# Patient Record
Sex: Female | Born: 1945 | Race: White | Hispanic: No | Marital: Married
Health system: Southern US, Community
[De-identification: ages and names within clinical notes are randomized; demographics above are authoritative.]

## PROBLEM LIST (undated history)

## (undated) DIAGNOSIS — E119 Type 2 diabetes mellitus without complications: Secondary | ICD-10-CM

## (undated) DIAGNOSIS — I10 Essential (primary) hypertension: Secondary | ICD-10-CM

## (undated) DIAGNOSIS — G459 Transient cerebral ischemic attack, unspecified: Secondary | ICD-10-CM

## (undated) DIAGNOSIS — E079 Disorder of thyroid, unspecified: Secondary | ICD-10-CM

## (undated) DIAGNOSIS — I639 Cerebral infarction, unspecified: Secondary | ICD-10-CM

## (undated) HISTORY — DX: Disorder of thyroid, unspecified: E07.9

## (undated) HISTORY — DX: Essential (primary) hypertension: I10

## (undated) HISTORY — DX: Transient cerebral ischemic attack, unspecified: G45.9

## (undated) HISTORY — DX: Type 2 diabetes mellitus without complications: E11.9

## (undated) HISTORY — DX: Cerebral infarction, unspecified: I63.9

---

## 1986-12-01 DIAGNOSIS — Z923 Personal history of irradiation: Secondary | ICD-10-CM

## 1986-12-01 DIAGNOSIS — C50919 Malignant neoplasm of unspecified site of unspecified female breast: Secondary | ICD-10-CM

## 1986-12-01 HISTORY — DX: Personal history of irradiation: Z92.3

## 1986-12-01 HISTORY — PX: BREAST LUMPECTOMY: SHX2

## 1986-12-01 HISTORY — DX: Malignant neoplasm of unspecified site of unspecified female breast: C50.919

## 2016-09-18 DIAGNOSIS — E119 Type 2 diabetes mellitus without complications: Secondary | ICD-10-CM | POA: Insufficient documentation

## 2017-05-17 DIAGNOSIS — I63231 Cerebral infarction due to unspecified occlusion or stenosis of right carotid arteries: Secondary | ICD-10-CM | POA: Insufficient documentation

## 2019-10-06 DIAGNOSIS — M150 Primary generalized (osteo)arthritis: Secondary | ICD-10-CM | POA: Insufficient documentation

## 2019-10-06 DIAGNOSIS — M0579 Rheumatoid arthritis with rheumatoid factor of multiple sites without organ or systems involvement: Secondary | ICD-10-CM | POA: Insufficient documentation

## 2019-12-20 DIAGNOSIS — F339 Major depressive disorder, recurrent, unspecified: Secondary | ICD-10-CM | POA: Insufficient documentation

## 2019-12-20 DIAGNOSIS — N200 Calculus of kidney: Secondary | ICD-10-CM | POA: Insufficient documentation

## 2020-07-11 ENCOUNTER — Other Ambulatory Visit: Payer: Self-pay | Admitting: Family Medicine

## 2020-07-11 DIAGNOSIS — Z1231 Encounter for screening mammogram for malignant neoplasm of breast: Secondary | ICD-10-CM

## 2020-07-24 ENCOUNTER — Ambulatory Visit
Admission: RE | Admit: 2020-07-24 | Discharge: 2020-07-24 | Disposition: A | Discharge status: Home / Self Care | Payer: Medicare Other | Source: Ambulatory Visit | Attending: Family Medicine | Admitting: Family Medicine

## 2020-07-24 ENCOUNTER — Other Ambulatory Visit: Payer: Self-pay

## 2020-07-24 DIAGNOSIS — Z1231 Encounter for screening mammogram for malignant neoplasm of breast: Secondary | ICD-10-CM

## 2020-08-07 ENCOUNTER — Other Ambulatory Visit: Payer: Self-pay

## 2020-08-07 ENCOUNTER — Ambulatory Visit
Admission: RE | Admit: 2020-08-07 | Discharge: 2020-08-07 | Disposition: A | Discharge status: Home / Self Care | Payer: Medicare Other | Source: Ambulatory Visit | Attending: Family Medicine | Admitting: Family Medicine

## 2020-08-21 ENCOUNTER — Other Ambulatory Visit: Payer: Self-pay

## 2020-08-21 ENCOUNTER — Ambulatory Visit
Admission: RE | Admit: 2020-08-21 | Discharge: 2020-08-21 | Disposition: A | Discharge status: Home / Self Care | Payer: Medicare Other | Source: Ambulatory Visit | Attending: Family Medicine | Admitting: Family Medicine

## 2020-08-21 ENCOUNTER — Other Ambulatory Visit: Payer: Self-pay | Admitting: Family Medicine

## 2020-08-21 DIAGNOSIS — R0602 Shortness of breath: Secondary | ICD-10-CM

## 2020-08-21 DIAGNOSIS — R7989 Other specified abnormal findings of blood chemistry: Secondary | ICD-10-CM

## 2020-08-21 MED ORDER — IOPAMIDOL (ISOVUE-370) INJECTION 76%
75.0000 mL | Freq: Once | INTRAVENOUS | Status: AC | PRN
  Administered 2020-08-21: 75 mL via INTRAVENOUS

## 2021-01-07 DIAGNOSIS — R9439 Abnormal result of other cardiovascular function study: Secondary | ICD-10-CM | POA: Insufficient documentation

## 2021-03-12 DIAGNOSIS — R0609 Other forms of dyspnea: Secondary | ICD-10-CM | POA: Insufficient documentation

## 2021-06-27 ENCOUNTER — Other Ambulatory Visit: Payer: Self-pay | Admitting: Family Medicine

## 2021-06-27 DIAGNOSIS — Z1231 Encounter for screening mammogram for malignant neoplasm of breast: Secondary | ICD-10-CM

## 2021-08-20 ENCOUNTER — Other Ambulatory Visit: Payer: Self-pay

## 2021-08-20 ENCOUNTER — Ambulatory Visit
Admission: RE | Admit: 2021-08-20 | Discharge: 2021-08-20 | Disposition: A | Discharge status: Home / Self Care | Payer: Medicare Other | Source: Ambulatory Visit | Attending: Family Medicine | Admitting: Family Medicine

## 2021-08-20 DIAGNOSIS — Z1231 Encounter for screening mammogram for malignant neoplasm of breast: Secondary | ICD-10-CM

## 2021-12-13 DIAGNOSIS — R918 Other nonspecific abnormal finding of lung field: Secondary | ICD-10-CM | POA: Insufficient documentation

## 2022-02-07 DIAGNOSIS — B009 Herpesviral infection, unspecified: Secondary | ICD-10-CM | POA: Insufficient documentation

## 2022-02-07 DIAGNOSIS — L710 Perioral dermatitis: Secondary | ICD-10-CM | POA: Insufficient documentation

## 2022-02-07 DIAGNOSIS — L719 Rosacea, unspecified: Secondary | ICD-10-CM | POA: Insufficient documentation

## 2022-02-28 ENCOUNTER — Other Ambulatory Visit: Payer: Self-pay

## 2022-02-28 ENCOUNTER — Encounter (HOSPITAL_COMMUNITY): Payer: Self-pay

## 2022-02-28 ENCOUNTER — Observation Stay (HOSPITAL_COMMUNITY)
Admission: EM | Admit: 2022-02-28 | Discharge: 2022-03-01 | Disposition: A | Discharge status: Home / Self Care | Payer: Medicare Other | Attending: Internal Medicine | Admitting: Internal Medicine

## 2022-02-28 ENCOUNTER — Emergency Department (HOSPITAL_COMMUNITY): Payer: Medicare Other

## 2022-02-28 ENCOUNTER — Observation Stay (HOSPITAL_COMMUNITY): Payer: Medicare Other

## 2022-02-28 DIAGNOSIS — I6523 Occlusion and stenosis of bilateral carotid arteries: Secondary | ICD-10-CM | POA: Insufficient documentation

## 2022-02-28 DIAGNOSIS — I700 Atherosclerosis of aorta: Secondary | ICD-10-CM | POA: Diagnosis not present

## 2022-02-28 DIAGNOSIS — G9340 Encephalopathy, unspecified: Secondary | ICD-10-CM | POA: Diagnosis not present

## 2022-02-28 DIAGNOSIS — J9811 Atelectasis: Secondary | ICD-10-CM | POA: Insufficient documentation

## 2022-02-28 DIAGNOSIS — R531 Weakness: Secondary | ICD-10-CM | POA: Insufficient documentation

## 2022-02-28 DIAGNOSIS — Z923 Personal history of irradiation: Secondary | ICD-10-CM | POA: Insufficient documentation

## 2022-02-28 DIAGNOSIS — Z7902 Long term (current) use of antithrombotics/antiplatelets: Secondary | ICD-10-CM | POA: Diagnosis not present

## 2022-02-28 DIAGNOSIS — Z8673 Personal history of transient ischemic attack (TIA), and cerebral infarction without residual deficits: Secondary | ICD-10-CM | POA: Diagnosis not present

## 2022-02-28 DIAGNOSIS — R4701 Aphasia: Secondary | ICD-10-CM | POA: Insufficient documentation

## 2022-02-28 DIAGNOSIS — Z79899 Other long term (current) drug therapy: Secondary | ICD-10-CM | POA: Diagnosis not present

## 2022-02-28 DIAGNOSIS — Z853 Personal history of malignant neoplasm of breast: Secondary | ICD-10-CM | POA: Diagnosis not present

## 2022-02-28 DIAGNOSIS — Z9889 Other specified postprocedural states: Secondary | ICD-10-CM | POA: Diagnosis not present

## 2022-02-28 DIAGNOSIS — R29818 Other symptoms and signs involving the nervous system: Secondary | ICD-10-CM | POA: Insufficient documentation

## 2022-02-28 DIAGNOSIS — I6782 Cerebral ischemia: Secondary | ICD-10-CM | POA: Insufficient documentation

## 2022-02-28 DIAGNOSIS — I672 Cerebral atherosclerosis: Secondary | ICD-10-CM | POA: Insufficient documentation

## 2022-02-28 DIAGNOSIS — G4733 Obstructive sleep apnea (adult) (pediatric): Secondary | ICD-10-CM | POA: Insufficient documentation

## 2022-02-28 DIAGNOSIS — E119 Type 2 diabetes mellitus without complications: Secondary | ICD-10-CM | POA: Diagnosis not present

## 2022-02-28 DIAGNOSIS — Z7982 Long term (current) use of aspirin: Secondary | ICD-10-CM | POA: Diagnosis not present

## 2022-02-28 DIAGNOSIS — R2990 Unspecified symptoms and signs involving the nervous system: Secondary | ICD-10-CM

## 2022-02-28 DIAGNOSIS — R930 Abnormal findings on diagnostic imaging of skull and head, not elsewhere classified: Secondary | ICD-10-CM | POA: Insufficient documentation

## 2022-02-28 DIAGNOSIS — M069 Rheumatoid arthritis, unspecified: Secondary | ICD-10-CM | POA: Diagnosis not present

## 2022-02-28 DIAGNOSIS — I080 Rheumatic disorders of both mitral and aortic valves: Secondary | ICD-10-CM | POA: Insufficient documentation

## 2022-02-28 DIAGNOSIS — D3A090 Benign carcinoid tumor of the bronchus and lung: Secondary | ICD-10-CM | POA: Insufficient documentation

## 2022-02-28 DIAGNOSIS — Z888 Allergy status to other drugs, medicaments and biological substances status: Secondary | ICD-10-CM | POA: Insufficient documentation

## 2022-02-28 DIAGNOSIS — E7220 Disorder of urea cycle metabolism, unspecified: Secondary | ICD-10-CM | POA: Insufficient documentation

## 2022-02-28 DIAGNOSIS — Z85118 Personal history of other malignant neoplasm of bronchus and lung: Secondary | ICD-10-CM | POA: Insufficient documentation

## 2022-02-28 DIAGNOSIS — D3A8 Other benign neuroendocrine tumors: Secondary | ICD-10-CM | POA: Insufficient documentation

## 2022-02-28 DIAGNOSIS — Z794 Long term (current) use of insulin: Secondary | ICD-10-CM | POA: Insufficient documentation

## 2022-02-28 DIAGNOSIS — G4700 Insomnia, unspecified: Secondary | ICD-10-CM | POA: Insufficient documentation

## 2022-02-28 DIAGNOSIS — E039 Hypothyroidism, unspecified: Secondary | ICD-10-CM | POA: Diagnosis not present

## 2022-02-28 DIAGNOSIS — R569 Unspecified convulsions: Secondary | ICD-10-CM

## 2022-02-28 DIAGNOSIS — G4489 Other headache syndrome: Secondary | ICD-10-CM | POA: Insufficient documentation

## 2022-02-28 DIAGNOSIS — I10 Essential (primary) hypertension: Secondary | ICD-10-CM | POA: Insufficient documentation

## 2022-02-28 DIAGNOSIS — G459 Transient cerebral ischemic attack, unspecified: Secondary | ICD-10-CM | POA: Diagnosis not present

## 2022-02-28 DIAGNOSIS — Z20822 Contact with and (suspected) exposure to covid-19: Secondary | ICD-10-CM | POA: Insufficient documentation

## 2022-02-28 DIAGNOSIS — I6602 Occlusion and stenosis of left middle cerebral artery: Secondary | ICD-10-CM | POA: Insufficient documentation

## 2022-02-28 DIAGNOSIS — R414 Neurologic neglect syndrome: Secondary | ICD-10-CM | POA: Insufficient documentation

## 2022-02-28 DIAGNOSIS — Z8709 Personal history of other diseases of the respiratory system: Secondary | ICD-10-CM | POA: Diagnosis not present

## 2022-02-28 DIAGNOSIS — Z91040 Latex allergy status: Secondary | ICD-10-CM | POA: Insufficient documentation

## 2022-02-28 DIAGNOSIS — Z79890 Hormone replacement therapy (postmenopausal): Secondary | ICD-10-CM | POA: Insufficient documentation

## 2022-02-28 DIAGNOSIS — I119 Hypertensive heart disease without heart failure: Secondary | ICD-10-CM | POA: Insufficient documentation

## 2022-02-28 DIAGNOSIS — R4182 Altered mental status, unspecified: Secondary | ICD-10-CM | POA: Diagnosis present

## 2022-02-28 DIAGNOSIS — Z7969 Long term (current) use of other immunomodulators and immunosuppressants: Secondary | ICD-10-CM | POA: Insufficient documentation

## 2022-02-28 DIAGNOSIS — Z8249 Family history of ischemic heart disease and other diseases of the circulatory system: Secondary | ICD-10-CM | POA: Insufficient documentation

## 2022-02-28 DIAGNOSIS — R29702 NIHSS score 2: Secondary | ICD-10-CM | POA: Insufficient documentation

## 2022-02-28 DIAGNOSIS — R9431 Abnormal electrocardiogram [ECG] [EKG]: Secondary | ICD-10-CM | POA: Insufficient documentation

## 2022-02-28 DIAGNOSIS — Z823 Family history of stroke: Secondary | ICD-10-CM | POA: Insufficient documentation

## 2022-02-28 DIAGNOSIS — R911 Solitary pulmonary nodule: Secondary | ICD-10-CM | POA: Insufficient documentation

## 2022-02-28 LAB — RAPID URINE DRUG SCREEN, HOSP PERFORMED
Amphetamines: NOT DETECTED
Barbiturates: NOT DETECTED
Benzodiazepines: NOT DETECTED
Cocaine: NOT DETECTED
Opiates: NOT DETECTED
Tetrahydrocannabinol: NOT DETECTED

## 2022-02-28 LAB — LIPID PANEL
Cholesterol: 199 mg/dL (ref 0–200)
HDL: 52 mg/dL (ref >40)
LDL Cholesterol: 136 mg/dL — ABNORMAL HIGH (ref 0–99)
Total CHOL/HDL Ratio: 3.8 RATIO
Triglycerides: 53 mg/dL (ref <150)
VLDL: 11 mg/dL (ref 0–40)

## 2022-02-28 LAB — CBC
HCT: 41.5 % (ref 36.0–46.0)
Hemoglobin: 14.1 g/dL (ref 12.0–15.0)
MCH: 30.6 pg (ref 26.0–34.0)
MCHC: 34 g/dL (ref 30.0–36.0)
MCV: 90 fL (ref 80.0–100.0)
Platelets: 362 10*3/uL (ref 150–400)
RBC: 4.61 MIL/uL (ref 3.87–5.11)
RDW: 13.1 % (ref 11.5–15.5)
WBC: 8.8 10*3/uL (ref 4.0–10.5)
nRBC: 0 % (ref 0.0–0.2)

## 2022-02-28 LAB — DIFFERENTIAL
Abs Immature Granulocytes: 0.03 10*3/uL (ref 0.00–0.07)
Basophils Absolute: 0.1 10*3/uL (ref 0.0–0.1)
Basophils Relative: 1 %
Eosinophils Absolute: 0.1 10*3/uL (ref 0.0–0.5)
Eosinophils Relative: 2 %
Immature Granulocytes: 0 %
Lymphocytes Relative: 24 %
Lymphs Abs: 2.1 10*3/uL (ref 0.7–4.0)
Monocytes Absolute: 1 10*3/uL (ref 0.1–1.0)
Monocytes Relative: 11 %
Neutro Abs: 5.4 10*3/uL (ref 1.7–7.7)
Neutrophils Relative %: 62 %

## 2022-02-28 LAB — COMPREHENSIVE METABOLIC PANEL
ALT: 24 U/L (ref 0–44)
AST: 26 U/L (ref 15–41)
Albumin: 3.6 g/dL (ref 3.5–5.0)
Alkaline Phosphatase: 87 U/L (ref 38–126)
Anion gap: 11 (ref 5–15)
BUN: 16 mg/dL (ref 8–23)
CO2: 24 mmol/L (ref 22–32)
Calcium: 9.1 mg/dL (ref 8.9–10.3)
Chloride: 99 mmol/L (ref 98–111)
Creatinine, Ser: 0.9 mg/dL (ref 0.44–1.00)
GFR, Estimated: >60 mL/min (ref >60)
Glucose, Bld: 111 mg/dL — ABNORMAL HIGH (ref 70–99)
Potassium: 3.7 mmol/L (ref 3.5–5.1)
Sodium: 134 mmol/L — ABNORMAL LOW (ref 135–145)
Total Bilirubin: 0.8 mg/dL (ref 0.3–1.2)
Total Protein: 7.1 g/dL (ref 6.5–8.1)

## 2022-02-28 LAB — RESP PANEL BY RT-PCR (FLU A&B, COVID) ARPGX2
Influenza A by PCR: NEGATIVE
Influenza B by PCR: NEGATIVE
SARS Coronavirus 2 by RT PCR: NEGATIVE

## 2022-02-28 LAB — HEMOGLOBIN A1C
Hgb A1c MFr Bld: 6.9 % — ABNORMAL HIGH (ref 4.8–5.6)
Mean Plasma Glucose: 151.33 mg/dL

## 2022-02-28 LAB — I-STAT CHEM 8, ED
BUN: 18 mg/dL (ref 8–23)
Calcium, Ion: 0.81 mmol/L — CL (ref 1.15–1.40)
Chloride: 101 mmol/L (ref 98–111)
Creatinine, Ser: 0.9 mg/dL (ref 0.44–1.00)
Glucose, Bld: 107 mg/dL — ABNORMAL HIGH (ref 70–99)
HCT: 42 % (ref 36.0–46.0)
Hemoglobin: 14.3 g/dL (ref 12.0–15.0)
Potassium: 3.7 mmol/L (ref 3.5–5.1)
Sodium: 135 mmol/L (ref 135–145)
TCO2: 27 mmol/L (ref 22–32)

## 2022-02-28 LAB — URINALYSIS, ROUTINE W REFLEX MICROSCOPIC
Bilirubin Urine: NEGATIVE
Glucose, UA: NEGATIVE mg/dL
Hgb urine dipstick: NEGATIVE
Ketones, ur: NEGATIVE mg/dL
Leukocytes,Ua: NEGATIVE
Nitrite: NEGATIVE
Protein, ur: NEGATIVE mg/dL
Specific Gravity, Urine: >1.046 — ABNORMAL HIGH (ref 1.005–1.030)
pH: 7 (ref 5.0–8.0)

## 2022-02-28 LAB — APTT: aPTT: 25 seconds (ref 24–36)

## 2022-02-28 LAB — TROPONIN I (HIGH SENSITIVITY)
Troponin I (High Sensitivity): 13 ng/L (ref <18)
Troponin I (High Sensitivity): 17 ng/L (ref <18)

## 2022-02-28 LAB — PROTIME-INR
INR: 1 (ref 0.8–1.2)
Prothrombin Time: 13.4 seconds (ref 11.4–15.2)

## 2022-02-28 LAB — AMMONIA: Ammonia: 156 umol/L — ABNORMAL HIGH (ref 9–35)

## 2022-02-28 LAB — VITAMIN B12: Vitamin B-12: 522 pg/mL (ref 180–914)

## 2022-02-28 LAB — TSH: TSH: 1.383 u[IU]/mL (ref 0.350–4.500)

## 2022-02-28 LAB — ETHANOL: Alcohol, Ethyl (B): <10 mg/dL (ref <10)

## 2022-02-28 MED ORDER — ACETAMINOPHEN 650 MG RE SUPP
650.0000 mg | Freq: Four times a day (QID) | RECTAL | Status: DC | PRN
Start: 2022-02-28 — End: 2022-03-01

## 2022-02-28 MED ORDER — LEFLUNOMIDE 20 MG PO TABS: 20.0000 mg | ORAL_TABLET | Freq: Every day | ORAL | Status: DC

## 2022-02-28 MED ORDER — POLYETHYLENE GLYCOL 3350 17 G PO PACK: 17.0000 g | PACK | Freq: Every day | ORAL | Status: DC | PRN

## 2022-02-28 MED ORDER — INSULIN GLARGINE-YFGN 100 UNIT/ML ~~LOC~~ SOLN
30.0000 [IU] | SUBCUTANEOUS | Status: DC
  Administered 2022-03-01: 30 [IU] via SUBCUTANEOUS
  Filled 2022-02-28: qty 0.3

## 2022-02-28 MED ORDER — ENOXAPARIN SODIUM 40 MG/0.4ML IJ SOSY: 40.0000 mg | PREFILLED_SYRINGE | INTRAMUSCULAR | Status: DC

## 2022-02-28 MED ORDER — ACETAMINOPHEN 325 MG PO TABS: 650.0000 mg | ORAL_TABLET | Freq: Four times a day (QID) | ORAL | Status: DC | PRN

## 2022-02-28 MED ORDER — MELATONIN 3 MG PO TABS
3.0000 mg | ORAL_TABLET | Freq: Every day | ORAL | Status: DC
  Administered 2022-02-28: 3 mg via ORAL
  Filled 2022-02-28: qty 1

## 2022-02-28 MED ORDER — LOSARTAN POTASSIUM 25 MG PO TABS
25.0000 mg | ORAL_TABLET | Freq: Every day | ORAL | Status: DC
  Administered 2022-03-01: 25 mg via ORAL
  Filled 2022-02-28: qty 1

## 2022-02-28 MED ORDER — IOHEXOL 350 MG/ML SOLN
130.0000 mL | Freq: Once | INTRAVENOUS | Status: AC | PRN
  Administered 2022-02-28: 130 mL via INTRAVENOUS

## 2022-02-28 MED ORDER — INSULIN ASPART 100 UNIT/ML IJ SOLN
10.0000 [IU] | Freq: Three times a day (TID) | INTRAMUSCULAR | Status: DC
  Administered 2022-03-01: 10 [IU] via SUBCUTANEOUS

## 2022-02-28 MED ORDER — LEVOTHYROXINE SODIUM 75 MCG PO TABS
75.0000 ug | ORAL_TABLET | Freq: Every day | ORAL | Status: DC
  Administered 2022-03-01: 75 ug via ORAL
  Filled 2022-02-28: qty 1

## 2022-02-28 NOTE — ED Provider Notes (Signed)
?[facility] ?Provider Note ? ? ?CSN: [id number] ?Arrival date & time: 02/28/22  1318 ? ?An emergency department physician performed an initial assessment on this suspected stroke patient at 1311. ? ?History ? ?Chief Complaint  ?Patient presents with  ? Code Stroke  ? Altered Mental Status  ? ? ?Diane Tate is a 76 y.o. female.  Past medical history CVA, diabetes, RA, hypertension, hypothyroidism, breast cancer.  Presenting to ER with concern for stroke.  EMS called for confusion, aphasia, left-sided neglect.  Husband reports that symptoms have significantly improved and patient is now near her baseline. ? ?HPI ? ?  ? ?Home Medications ?Prior to Admission medications   ?Medication Sig Start Date End Date Taking? Authorizing Provider  ?acetaminophen (TYLENOL) 500 MG tablet Take 1,000 mg by mouth every 6 (six) hours as needed for mild pain.   Yes [provider]  ?ARTIFICIAL TEAR OP Place 1 drop into both eyes daily as needed (dry eye).   Yes [provider]  ?escitalopram (LEXAPRO) 20 MG tablet Take 20 mg by mouth daily. 11/22/21  Yes [provider]  ?fluticasone (FLONASE) 50 MCG/ACT nasal spray Place 1 spray into both nostrils daily.   Yes [provider]  ?ibuprofen (ADVIL) 200 MG tablet Take 400 mg by mouth every 6 (six) hours as needed for mild pain.   Yes [provider]  ?Insulin Aspart (NOVOLOG FLEXPEN [place]) Inject 30 Units into the skin 3 (three) times daily before meals.   Yes [provider]  ?insulin degludec (TRESIBA FLEXTOUCH) 100 UNIT/ML FlexTouch Pen Inject 30 Units into the skin daily.   Yes [provider]  ?leflunomide (ARAVA) 10 MG tablet Take 20 mg by mouth daily. 10/14/21  Yes [provider]  ?levothyroxine (SYNTHROID) 75 MCG tablet Take 75 mcg by mouth daily. 11/22/21  Yes [provider]  ?losartan (COZAAR) 25 MG tablet Take 25 mg by mouth daily. 02/22/22  Yes [provider]  ?melatonin 5 MG TABS Take 5 mg by mouth at bedtime.   Yes [provider]  ?Multiple Vitamins-Minerals (MULTIVITAMINS THER. W/MINERALS) TABS tablet Take 1 tablet by mouth daily.   Yes [provider]  ?valACYclovir (VALTREX) 500 MG tablet Take 500 mg by mouth daily. 02/07/22  Yes [provider]  ?LANTUS SOLOSTAR 100 UNIT/ML Solostar Pen Inject into the skin. ?Patient not taking: Reported on 02/28/2022 01/28/22   [provider]  ?   ? ?Allergies    ?Latex, Penicillins, Cheese, Atorvastatin, and Lisinopril   ? ?Review of Systems   ?Review of Systems  ?Unable to perform ROS: Acuity of condition  ? ?Physical Exam ?Updated Vital Signs ?BP (!) 151/67   Pulse 91   Temp 97.6 ?F (36.4 ?C)   Resp 15   Wt 83.4 kg   SpO2 97%  ?Physical Exam ?Vitals and nursing note reviewed.  ?Constitutional:   ?   General: She is not in acute distress. ?   Appearance: She is well-developed.  ?HENT:  ?   Head: Normocephalic and atraumatic.  ?Eyes:  ?   Conjunctiva/sclera: Conjunctivae normal.  ?Cardiovascular:  ?   Rate and Rhythm: Normal rate and regular rhythm.  ?   Heart sounds: No murmur heard. ?Pulmonary:  ?   Effort: Pulmonary effort is normal. No respiratory distress.  ?   Breath sounds: Normal breath sounds.  ?Abdominal:  ?   Palpations: Abdomen is soft.  ?   Tenderness: There is no abdominal tenderness.  ?  Musculoskeletal:     ?   General: No swelling.  ?   Cervical back: Neck supple.  ?Skin: ?   General: Skin is warm and dry.  ?   Capillary Refill: Capillary refill takes less than 2 seconds.  ?Neurological:  ?   Mental Status: She is alert.  ?   Comments: Alert, oriented x3, moving all 4 extremities equally, sensation to light touch intact in all 4 extremities  ?Psychiatric:     ?   Mood and Affect: Mood normal.  ? ? ?ED Results / Procedures / Treatments   ?Labs ?(all labs ordered are listed, but only abnormal results are displayed) ?Labs Reviewed  ?COMPREHENSIVE METABOLIC PANEL -  Abnormal; Notable for the following components:  ?    Result Value  ? Sodium 134 (*)   ? Glucose, Bld 111 (*)   ? All other components within normal limits  ?URINALYSIS, ROUTINE W REFLEX MICROSCOPIC - Abnormal; Notable for the following components:  ? Specific Gravity, Urine >1.046 (*)   ? All other components within normal limits  ?I-STAT CHEM 8, ED - Abnormal; Notable for the following components:  ? Glucose, Bld 107 (*)   ? Calcium, Ion 0.81 (*)   ? All other components within normal limits  ?RESP PANEL BY RT-PCR (FLU A&B, COVID) ARPGX2  ?ETHANOL  ?PROTIME-INR  ?APTT  ?CBC  ?DIFFERENTIAL  ?RAPID URINE DRUG SCREEN, HOSP PERFORMED  ?VITAMIN B12  ?TSH  ?AMMONIA  ?TROPONIN I (HIGH SENSITIVITY)  ?TROPONIN I (HIGH SENSITIVITY)  ? ? ?EKG ?EKG Interpretation ? ?Date/Time:  Friday February 28 2022 13:48:02 EDT ?Ventricular Rate:  91 ?PR Interval:  162 ?QRS Duration: 78 ?QT Interval:  366 ?QTC Calculation: 451 ?R Axis:   24 ?Text Interpretation: Sinus rhythm Repol abnrm, severe global ischemia (LM/MVD) \no prior for comparison no acute STEMI Confirmed by Madalyn Rob [id number]) on 02/28/2022 2:19:10 PM ? ?Radiology ?CT HEAD CODE STROKE WO CONTRAST ? ?Result Date: 02/28/2022 ?CLINICAL DATA:  Code stroke. EXAM: CT HEAD WITHOUT CONTRAST TECHNIQUE: Contiguous axial images were obtained from the base of the skull through the vertex without intravenous contrast. RADIATION DOSE REDUCTION: This exam was performed according to the departmental dose-optimization program which includes automated exposure control, adjustment of the mA and/or kV according to patient size and/or use of iterative reconstruction technique. COMPARISON:  MRI May 13, 2017 without report. FINDINGS: Brain: Area of hypoattenuation and loss of gray differentiation in the high right frontal cortex (series 3, image 28), probably remote infarct given findings on prior MRI. There is another area of hypoattenuation loss of gray-white differentiation in the more inferior  and posterior frontal lobe and also in the right parietal lobe, which are more age indeterminate. No evidence of acute hemorrhage. No midline shift, hydrocephalus, or visible extra-axial fluid collection. Vascular: No hyperdense vessel identified. Skull: No acute fracture. Sinuses/Orbits: Clear sinuses.  No acute orbital findings. Other: No mastoid effusions. IMPRESSION: 1. Findings suggestive of chronic right MCA territory infarcts with some increased conspicuity of areas of hypoattenuation and loss of gray-white differentiation when comparing across modalities, which could represent age indeterminate infarcts. Given the concern for acute infarct and the patient's known malignancy, recommend MRI with contrast for further evaluation. 2. No acute hemorrhage. Findings and recommendations discussed with Dr. Quinn Axe via telephone at 1:29 p.m. Electronically Signed   By: Margaretha Sheffield M.D.   On: 02/28/2022 13:35  ? ?CT ANGIO HEAD NECK W WO CM (CODE STROKE) ? ?Addendum Date: 02/28/2022   ?ADDENDUM  REPORT: 02/28/2022 14:22 ADDENDUM: No emergent large vessel occlusion. This finding and CTA neck impression #1 called by telephone at the time of interpretation on 02/28/2022 at 2:22 pm to provider [facility] , who verbally acknowledged these results. Electronically Signed   By: Kellie Simmering D.O.   On: 02/28/2022 14:22  ? ?Result Date: 02/28/2022 ?CLINICAL DATA:  Neuro deficit, acute, stroke suspected;? Left neglect, history of carotid stenosis. EXAM: CT ANGIOGRAPHY HEAD AND NECK TECHNIQUE: Multidetector CT imaging of the head and neck was performed using the standard protocol during bolus administration of intravenous contrast. Multiplanar CT image reconstructions and MIPs were obtained to evaluate the vascular anatomy. Carotid stenosis measurements (when applicable) are obtained utilizing NASCET criteria, using the distal internal carotid diameter as the denominator. RADIATION DOSE REDUCTION: This exam was performed  according to the departmental dose-optimization program which includes automated exposure control, adjustment of the mA and/or kV according to patient size and/or use of iterative reconstruction technique. CONTRAST:

## 2022-02-28 NOTE — Code Documentation (Addendum)
Pt is a 76 yr old female with history of Breast cancer and prior Rt sided stroke. She is on no blood thinners. She was in her usual state of health today per husband at 1100 at her appointment. At 1130, she was having difficulty speaking and left sided weakness. EMS activated and code stroke alert placed. Pt arrived to [facility] at 1309. Labs and CBG obtained; airway cleared by EDP. Pt weighed and taken to CT at 1313. CTNC negative for acute abnormality per Dr. Quinn Axe. Pt exhibiting some loss of verbal fluency and some neglect of left side, NIHSS 2. (Please see Stroke Timeline for full NIHSS and additional information). After IV access obtained, CTA was obtained. Per Dr Quinn Axe, CTA neg for LVO. Pt taken back to room 31 where pt and husband were updated on plan of care by Dr Quinn Axe. Pt will need q 30 min VS and mNIHSS until outside of Thrombolytic window (1530). At that time she will need q 2 hr VS and mNIHSS for 12 hours, then q 4. Bedside handoff with Brittney RN complete. No TNK at this time as pt non focal with mild, improving exam. No NIR as pt is LVO negative. ?

## 2022-02-28 NOTE — Consult Note (Addendum)
Addendum 4/26: Hepatitis panel was ordered because patient had unexplained hyperammonemia in the setting of normal transaminases. Because elevated ammonia can precede elevated transaminases rarely in the setting of acute hepatitis, hepatitis panel was ordered to rule out hepatitis as a cause for her elevated ammonia. ? ?Su Monks, MD ?[facility] ?[phone] ? ?If 7pm- 7am, please page neurology on call as listed in [place]. ? ? ?Neurology Attending Attestation ?  ?I examined the patient and discussed plan with resident Dr. Corky Sox. I was present throughout the stroke code and made all significant decisions and personally reviewed CNS imaging and also discussed CTA results with radiologist by phone. I agree with the resident note below with the following additions/exceptions: ? ?Patient brought in with mental status and nonfocal examination. L sided weakness and dysarthria reported in the field had resolved prior to arrival. Initially had some difficulty naming which was felt to be 2/2 encephalopathy and possible mild L hemineglect that was poorly reproducible and potentially 2/2 residual effects from prior R MCA stroke. NIHSS = 2. TNK not administered 2/2 mild and rapidly improving sx. ? ?CTA showed possible nonocclusive thrombus vs artifact R V1. This would not correlate with her sx, but MRA neck wwo contrast has been ordered for further evaluation. MRI brain wwo contrast ordered to r/o acute infarct or met in setting of malignancy. ? ?Ammonia resulted severely elevated at 156 with normal LFTs and BUN/creatinine. Reason for hyperammonemia unclear. I will put in repeat ammonia and repeat CMP for AM. Given that she has no identifiable cause for hyperammonemia in the absence of transaminitis need to exclude lab error. It's possible hyperammonemia could precede transaminitis so will repeat CMP then as well and also put in acute hepatitis panel. Will hold arava given contraindication in liver  failure, although I am not aware this medication could cause isolated hyperammonemia. 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 ? ?Will continue to follow. ?  ?Su Monks, MD ?[facility] ?[phone] ?  ?If 7pm- 7am, please page neurology on call as listed in [place]. ? ? ?Neurology Consultation ? ?Reason for Consult: code stroke ?Referring Physician: Dr. Roslynn Amble ? ?CC: confusion, aphasia, L sided neglect ? ?History is obtained from: EMS, chart review, spouse ? ?HPI: Diane Tate is a 76 y.o. 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  She presents to the emergency department today as a code stroke, called for confusion, aphasia, and reported left-sided neglect. ? ?The patient's spouse was contacted.  He reports that the patient was last seen well at approximately 11 AM this morning.  He states that after their doctor's appointment ended at 11:30 AM, she exhibited confusion, repeatedly saying "where am I?".  He called EMS and on their arrival he noticed that the patient had slurred speech.  When asked if the patient has some level of confusion at baseline, the spouse gives an ambiguous answer.  ? ?When the patient was assessed this afternoon, she is able to state her name and the month.  She states her age as 76 years old.  She appears disoriented to location and does not understand where she is.  At times the patient has difficulty participating in the exam.  For instance, when checking sensation  the patient appears unable to remember the word "left".   ? ?LKW: 1100 ?TNK given?: no, too minimal to treat ?IR Thrombectomy? No, no evidence of LVO ?Modified Rankin  Scale: 0-Completely asymptomatic and back to baseline post- stroke ? ?ROS: Unable to obtain due to altered mental status.  ? ?Past Medical History:  ?Diagnosis Date  ? Breast cancer ([place]) 1988  ? Right Breast  ? Personal history of radiation therapy 1988  ? Right Breast  ? ? ? ?History reviewed. No pertinent family history. ? ? ?Social History:  ? has no history on file for tobacco use, alcohol use, and drug use. ? ?Medications ?No current facility-administered medications for this encounter. ?No current outpatient medications on file. ? ? ?Exam: ?Current vital signs: ?BP (!) 161/106 (BP Location: Right Arm)   Pulse 97   Temp 97.6 ?F (36.4 ?C)   Resp 18   Wt 83.4 kg   SpO2 97%  ?Vital signs in last 24 hours: ?Temp:  [97.6 ?F (36.4 ?C)] 97.6 ?F (36.4 ?C) (03/31 1344) ?Pulse Rate:  [97] 97 (03/31 1341) ?Resp:  [18] 18 (03/31 1344) ?BP: (161)/(106) 161/106 (03/31 1341) ?SpO2:  [97 %] 97 % (03/31 1341) ?Weight:  [83.4 kg] 83.4 kg (03/31 1300) ? ?GENERAL: Awake, alert, in no acute distress ?Psych: Affect appropriate for situation, patient is calm and cooperative with examination ?Head: Normocephalic and atraumatic, without obvious abnormality ?EENT: Normal conjunctivae, dry mucous membranes, no OP obstruction ?LUNGS: Normal respiratory effort. Non-labored breathing on room air ?CV: Regular rate and rhythm on telemetry ?Extremities: warm, well perfused, without obvious deformity ? ?NEURO:  ?Mental Status: Awake, alert, oriented to person and time but not location ? ?NIHSS: ?1a Level of Conscious.: 0 ?1b LOC Questions: 0 ?1c LOC Commands: 0 ?2 Best Gaze: 0 ?3 Visual: 0 ?4 Facial Palsy: 0  ?5a Motor Arm - left: 0  ?5b Motor Arm - Right: 0 ?6a Motor Leg - Left: 0 ?6b Motor Leg - Right: 0 ?7 Limb Ataxia: 0 ?8 Sensory: 0 ?9 Best Language: 1 ?10 Dysarthria: 0 ?11 Extinct. and Inatten.: 1 ?TOTAL: 2 ? ? ?Labs ?I have reviewed labs in epic and the results pertinent to this consultation are: ?Glc: 107, Cr of 0.9  ? ?CBC ?    ?Component Value Date/Time  ? WBC 8.8 02/28/2022 1313  ? RBC 4.61 02/28/2022 1313  ? HGB 14.3 02/28/2022 1318  ? HCT 42.0 02/28/2022 1318  ? PLT 362 02/28/2022 1313  ? MCV 90.0 02/28/2022 1313  ? MCH 30.6 02/28/2022 1313  ? MCHC 34.0 02/28/2022 1313  ? RDW 13.1 02/28/2022 1313  ? LYMPHSABS 2.1 02/28/2022 1313  ? MONOABS 1.0 02/28/2022 1313  ? EOSABS 0.1 02/28/2022 1313  ? BASOSABS 0.1 02/28/2022 1313  ? ? ?CMP  ?   ?Component Value Date/Time  ? NA 135 02/28/2022 1318  ? K 3.7 02/28/2022 1318  ? CL 101 02/28/2022 1318  ? GLUCOSE 107 (H) 02/28/2022 1318  ? BUN 18 02/28/2022 1318  ? CREATININE 0.90 02/28/2022 1318  ? ? ?Lipid Panel  ?No results found for: CHOL, TRIG, HDL, CHOLHDL, VLDL, LDLCALC, LDLDIRECT ? ? ?Imaging ? ?[place] ?IMPRESSION: ?1. Findings suggestive of chronic right MCA territory infarcts with ?some increased conspicuity of areas of hypoattenuation and loss of ?gray-white differentiation when comparing across modalities, which ?could represent age indeterminate infarcts. Given the concern for ?acute infarct and the patient's known malignancy, recommend MRI with ?contrast for further evaluation. ?2. No acute hemorrhage. ? ?CTA-Head and Neck ?  IMPRESSION: ?CTA neck: ?1. A small filling defect concerning for thrombus is questioned ?within the V1 right vertebral artery (versus artifact)(see series ?11, image 147). ?2. The common carotid and internal carotid arteries are patent ?within the neck. Atherosclerotic plaque results in 20-30% stenosis ?at the origin of the left ICA. Mild soft plaque scattered within the ?right common carotid artery. ?3. Aortic Atherosclerosis (ICD10-I70.0). ?CTA head: ?1. No intracranial large vessel occlusion identified. ?2. Intracranial atherosclerotic disease. Most notably, there is a ?severe stenosis within a mid M2 left MCA branch, new from the CTA ?head of 05/16/2017. ? ? ?Assessment:  ?Diane Tate is a 76 y.o. female with past medical history of CVA thought to be 2/2  stenosis of the right carotid artery (s/p CEA) without reported deficits, insulin-dependent T2DM, rheumatoid arthritis, hypertension, hypothyroidism, and right breast cancer s/p lumpectomy with axillary node dissection

## 2022-02-28 NOTE — Progress Notes (Signed)
Received a page from RN regarding patient refusing pelvis radiography.  Spoke with the patient and she stated there is nothing wrong with her hip and she does not want the pelvis x-ray.  Explained the reason for ordering the x-ray was to rule out presence of hardware as she was altered at that time.  Patient was alert and oriented x4, and stated she does not want the x-ray done.  She also stated she does not have any hardware present in her body.  MRI contacted to proceed with the imaging. ? ?Idamae Schuller, MD ?[facility] ?Internal Medicine Residency, PGY-1  ?

## 2022-02-28 NOTE — ED Triage Notes (Signed)
Pt BIB [facility] from the drs office. Pt had a visit and on the way out per family pt became confused with slurred speech and seem to be neglecting the left side.  ?

## 2022-02-28 NOTE — H&P (Signed)
? ?Date: 02/28/2022     ?     ?     ?Patient Name:  Diane Tate MRN: [id number]  ?DOB: 03/14/1946 Age / Sex: 76 y.o., female   ?PCP: Nickola Major, MD    ?     ?Medical Service: Internal Medicine Teaching Service    ?     ?Attending Physician: Dr. Jimmye Norman, Elaina Pattee, MD    ?First Contact: Farrel Gordon, DO Pager: ED [phone]  ?Second Contact: Harvie Heck, MD Pager: SA [phone]  ?     ?After Hours (After 5p/  First Contact Pager: [phone]  ?weekends / holidays): Second Contact Pager: [phone]  ? ?SUBJECTIVE  ?Chief Complaint: AMS ? ?History of Present Illness: Diane Tate is a 76 y.o. 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 ? ?History obtained from patient and chart review. Patient reports being in her usual state of health this morning. She had an appointment to get her stitches removed from recent right lung wedge resection around 1100. When she returned home, patient noted to have confusion with difficulty speaking and some left sided weakness for which EMS was called by husband and Code Stroke activated. Patient reports she "had a strange day" but is unable to recall the events following her appointment this morning. She currently endorses a headache "out of right eye". She is concerned about having a repeat "TIA". She notes previous CVA on 05/16/2017 at which time she had slurred speech and facial droop. She does not recall undergoing CEA but notes was started on aspirin.  ?Prior to current event, reports having some insomnia due to discomfort from the stitches for which she was recommended to take melatonin. Patient denies any fevers/chills, lightheadedness/dizziness, altered sensation in  extremities, difficulty with speech, chest pain, vision changes, shortness of breath, nausea/vomiting, abdominal pain, diarrhea, or recent illnesses. She reports having all her immunizations.  ? ?Patient presented as CODE STROKE. NIHSS 2. CT Head obtained with chronic/age indeterminant right MCA territory infarcts without acute hemorrhage. CTA Head/Neck with small filling defect of right vertebral artery concerning for possible thrombus; patent common and internal carotid arteries and no intracranial large vessel occlusion. As she had improvement of symptoms, no TKN was given. EEG obtained which was suggestive of cortical dysfunction from left hemisphere with diffuse encephalopathy without seizure or epileptiform discharge. Patient admitted for further stroke work up.  ? ?Medications: ?No current facility-administered medications on file prior to encounter.  ? ?Current Outpatient Medications on File Prior to Encounter  ?Medication Sig Dispense Refill  ? acetaminophen (TYLENOL) 500 MG tablet Take 1,000 mg by mouth every 6 (six) hours as needed for mild pain.    ? ARTIFICIAL TEAR OP Place 1 drop into both eyes daily as needed (dry eye).    ? escitalopram (LEXAPRO) 20 MG tablet Take 20 mg by mouth daily.    ? fluticasone (FLONASE) 50 MCG/ACT nasal spray Place 1 spray into both nostrils daily.    ? ibuprofen (ADVIL) 200 MG tablet Take 400 mg by mouth every 6 (six) hours as needed for mild pain.    ? Insulin Aspart (NOVOLOG FLEXPEN [place]) Inject 30 Units into the skin 3 (three) times daily before meals.    ? insulin degludec (TRESIBA FLEXTOUCH)  100 UNIT/ML FlexTouch Pen Inject 30 Units into the skin daily.    ? leflunomide (ARAVA) 10 MG tablet Take 20 mg by mouth daily.    ? levothyroxine (SYNTHROID) 75 MCG tablet Take 75 mcg by mouth daily.    ? losartan (COZAAR) 25 MG tablet Take 25 mg by mouth daily.    ? melatonin 5 MG TABS Take 5 mg by mouth at bedtime.    ? Multiple Vitamins-Minerals (MULTIVITAMINS THER. W/MINERALS)  TABS tablet Take 1 tablet by mouth daily.    ? valACYclovir (VALTREX) 500 MG tablet Take 500 mg by mouth daily.    ? LANTUS SOLOSTAR 100 UNIT/ML Solostar Pen Inject into the skin. (Patient not taking: Reported on 02/28/2022)    ? ? ?Past Medical History:  ?Past Medical History:  ?Diagnosis Date  ? Breast cancer ([place]) 1988  ? Right Breast  ? Personal history of radiation therapy 1988  ? Right Breast  ? ? ?Social:  ?Patient lives at home with her husband in [place]. Independent in ADLs.  ?Previously, reports having one drink per week. None currently. Denies any tobacco use or illicit substance use history.  ? ?Family History: ?Father - bone cancer ?Mother - stroke, alcohol use disorder ?Aunt - bladder cancer ?Aunt - coronary artery disease, hypertension ? ?Allergies: ?Allergies as of 02/28/2022 - Review Complete 02/28/2022  ?Allergen Reaction Noted  ? Latex Hives and Itching 02/28/2022  ? Penicillins Hives 02/28/2022  ? Cheese Other (See Comments) 02/28/2022  ? Atorvastatin Other (See Comments) 07/16/2017  ? Lisinopril Cough and Other (See Comments) 05/17/2017  ? ? ?Review of Systems: ?A complete ROS was negative except as per HPI.  ? ?OBJECTIVE:  ?Physical Exam: ?Blood pressure 137/66, pulse 84, temperature 97.6 ?F (36.4 ?C), resp. rate 13, weight 83.4 kg, SpO2 98 %. ?Physical Exam  ?Constitutional: Generally well-developed and well-nourished. No distress.  ?HENT: Normocephalic and atraumatic, EOMI, conjunctiva normal, moist mucous membranes ?Cardiovascular: Normal rate, regular rhythm, S1 and S2 present, no murmurs, rubs, gallops.  Distal pulses intact ?Respiratory: No respiratory distress, Lungs are clear to auscultation bilaterally. Saturating well on room air. ?GI: Nondistended, soft, nontender to palpation, normal bowel sounds ?Musculoskeletal: Normal bulk and tone.  No peripheral edema noted. ?Neurological: Is alert and oriented x4, CN II-XII intact. Strength 5/5 in bilateral upper and  lower extremities. Sensation to light touch grossly intact. Finger to nose and heel to shin nl.  ?Skin: Warm and dry.  Well-healing scars noted in the right posterior and lateral chest wall without evidence of infection. No rash, erythema, lesions noted. ?Psychiatric: Normal mood and affect.  ? ?Pertinent Labs: ?CBC ?   ?Component Value Date/Time  ? WBC 8.8 02/28/2022 1313  ? RBC 4.61 02/28/2022 1313  ? HGB 14.3 02/28/2022 1318  ? HCT 42.0 02/28/2022 1318  ? PLT 362 02/28/2022 1313  ? MCV 90.0 02/28/2022 1313  ? MCH 30.6 02/28/2022 1313  ? MCHC 34.0 02/28/2022 1313  ? RDW 13.1 02/28/2022 1313  ? LYMPHSABS 2.1 02/28/2022 1313  ? MONOABS 1.0 02/28/2022 1313  ? EOSABS 0.1 02/28/2022 1313  ? BASOSABS 0.1 02/28/2022 1313  ?  ? ?CMP  ?   ?Component Value Date/Time  ? NA 135 02/28/2022 1318  ? K 3.7 02/28/2022 1318  ? CL 101 02/28/2022 1318  ? CO2 24 02/28/2022 1313  ? GLUCOSE 107 (H) 02/28/2022 1318  ? BUN 18 02/28/2022 1318  ? CREATININE 0.90 02/28/2022 1318  ? CALCIUM 9.1 02/28/2022 1313  ?  PROT 7.1 02/28/2022 1313  ? ALBUMIN 3.6 02/28/2022 1313  ? AST 26 02/28/2022 1313  ? ALT 24 02/28/2022 1313  ? ALKPHOS 87 02/28/2022 1313  ? BILITOT 0.8 02/28/2022 1313  ? GFRNONAA >60 02/28/2022 1313  ? ? ?Pertinent Imaging: ?EEG adult ? ?Result Date: 02/28/2022 ?Lora Havens, MD     02/28/2022  4:38 PM Patient Name: Diane Tate MRN: [id number] Epilepsy Attending: Lora Havens Referring Physician/Provider: Corky Sox, MD Date: 02/28/2022 Duration: 22.45 mins Patient history: Pt is a 75 yr old female with history of Breast cancer and prior Rt sided stroke. She is on no blood thinners. She was in her usual state of health today per husband at 1100 at her appointment. At 1130, she was having difficulty speaking and left sided weakness. EEG to evaluate for seizure Level of alertness: Awake AEDs during EEG study: None Technical aspects: This EEG study was done with scalp electrodes positioned according to the 10-20  International system of electrode placement. Electrical activity was acquired at a sampling rate of '500Hz'$  and reviewed with a high frequency filter of '70Hz'$  and a low frequency filter of '1Hz'$ . EEG data were recor

## 2022-02-28 NOTE — Progress Notes (Signed)
Pt refused pelvis xray per radiology tech. MD notified.  ?

## 2022-02-28 NOTE — Progress Notes (Signed)
EEG complete - results pending 

## 2022-02-28 NOTE — Procedures (Signed)
Patient Name: Diane Tate  ?MRN: [id number]  ?Epilepsy Attending: Lora Havens  ?Referring Physician/Provider: Corky Sox, MD ?Date: 02/28/2022 ?Duration: 22.45 mins ? ?Patient history: Pt is a 76 yr old female with history of Breast cancer and prior Rt sided stroke. She is on no blood thinners. She was in her usual state of health today per husband at 1100 at her appointment. At 1130, she was having difficulty speaking and left sided weakness. EEG to evaluate for seizure ? ?Level of alertness: Awake ? ?AEDs during EEG study: None ? ?Technical aspects: This EEG study was done with scalp electrodes positioned according to the 10-20 International system of electrode placement. Electrical activity was acquired at a sampling rate of '500Hz'$  and reviewed with a high frequency filter of '70Hz'$  and a low frequency filter of '1Hz'$ . EEG data were recorded continuously and digitally stored.  ? ?Description: No clear posterior dominant rhythm was seen. EEG showed continuous generalized and lateralized right hemisphere  3 to 6 Hz theta-delta slowing. Hyperventilation and photic stimulation were not performed.    ? ?ABNORMALITY ?- Continuous slow, generalized and lateralized right hemisphere  ? ?IMPRESSION: ?This study is suggestive of cortical dysfunction arising from left hemisphere likely secondary to underlying structural abnormality. Additionally there is moderate/severe diffuse encephalopathy, nonspecific etiology. No seizures or epileptiform discharges were seen throughout the recording. ? ?Lora Havens  ? ?

## 2022-03-01 ENCOUNTER — Observation Stay (HOSPITAL_COMMUNITY): Payer: Medicare Other

## 2022-03-01 ENCOUNTER — Other Ambulatory Visit (HOSPITAL_COMMUNITY): Payer: Self-pay | Admitting: Radiology

## 2022-03-01 ENCOUNTER — Other Ambulatory Visit (HOSPITAL_COMMUNITY): Payer: PRIVATE HEALTH INSURANCE

## 2022-03-01 DIAGNOSIS — R2990 Unspecified symptoms and signs involving the nervous system: Secondary | ICD-10-CM | POA: Diagnosis not present

## 2022-03-01 DIAGNOSIS — G9340 Encephalopathy, unspecified: Secondary | ICD-10-CM | POA: Diagnosis not present

## 2022-03-01 DIAGNOSIS — R569 Unspecified convulsions: Secondary | ICD-10-CM | POA: Diagnosis not present

## 2022-03-01 DIAGNOSIS — G459 Transient cerebral ischemic attack, unspecified: Secondary | ICD-10-CM | POA: Diagnosis not present

## 2022-03-01 LAB — AMMONIA: Ammonia: 17 umol/L (ref 9–35)

## 2022-03-01 LAB — COMPREHENSIVE METABOLIC PANEL
ALT: 21 U/L (ref 0–44)
AST: 23 U/L (ref 15–41)
Albumin: 2.9 g/dL — ABNORMAL LOW (ref 3.5–5.0)
Alkaline Phosphatase: 73 U/L (ref 38–126)
Anion gap: 8 (ref 5–15)
BUN: 14 mg/dL (ref 8–23)
CO2: 23 mmol/L (ref 22–32)
Calcium: 8.6 mg/dL — ABNORMAL LOW (ref 8.9–10.3)
Chloride: 103 mmol/L (ref 98–111)
Creatinine, Ser: 0.85 mg/dL (ref 0.44–1.00)
GFR, Estimated: >60 mL/min (ref >60)
Glucose, Bld: 127 mg/dL — ABNORMAL HIGH (ref 70–99)
Potassium: 3.6 mmol/L (ref 3.5–5.1)
Sodium: 134 mmol/L — ABNORMAL LOW (ref 135–145)
Total Bilirubin: 0.7 mg/dL (ref 0.3–1.2)
Total Protein: 6 g/dL — ABNORMAL LOW (ref 6.5–8.1)

## 2022-03-01 LAB — ECHOCARDIOGRAM COMPLETE
AR max vel: 2.42 cm2
AV Area VTI: 2.3 cm2
AV Area mean vel: 2.28 cm2
AV Mean grad: 3 mmHg
AV Peak grad: 6 mmHg
Ao pk vel: 1.22 m/s
Area-P 1/2: 3.72 cm2
S' Lateral: 2 cm
Weight: 2941.82 oz

## 2022-03-01 LAB — GLUCOSE, CAPILLARY
Glucose-Capillary: 210 mg/dL — ABNORMAL HIGH (ref 70–99)
Glucose-Capillary: 85 mg/dL (ref 70–99)
Glucose-Capillary: 118 mg/dL — ABNORMAL HIGH (ref 70–99)

## 2022-03-01 LAB — HEPATITIS PANEL, ACUTE
HCV Ab: NONREACTIVE
Hep A IgM: NONREACTIVE
Hep B C IgM: NONREACTIVE
Hepatitis B Surface Ag: NONREACTIVE

## 2022-03-01 MED ORDER — GADOBUTROL 1 MMOL/ML IV SOLN
8.5000 mL | Freq: Once | INTRAVENOUS | Status: AC | PRN
  Administered 2022-03-01: 8.5 mL via INTRAVENOUS

## 2022-03-01 MED ORDER — CLOPIDOGREL BISULFATE 75 MG PO TABS
75.0000 mg | ORAL_TABLET | Freq: Every day | ORAL | Status: DC
  Administered 2022-03-01: 75 mg via ORAL
  Filled 2022-03-01: qty 1

## 2022-03-01 MED ORDER — ASPIRIN 81 MG PO CHEW
81.0000 mg | CHEWABLE_TABLET | Freq: Every day | ORAL | Status: DC
  Administered 2022-03-01: 81 mg via ORAL
  Filled 2022-03-01: qty 1

## 2022-03-01 MED ORDER — ROSUVASTATIN CALCIUM 40 MG PO TABS: 40.0000 mg | ORAL_TABLET | Freq: Every day | ORAL | 0 refills | Status: AC

## 2022-03-01 MED ORDER — LORAZEPAM 0.5 MG PO TABS
0.2500 mg | ORAL_TABLET | Freq: Once | ORAL | Status: AC
  Administered 2022-03-01: 0.25 mg via ORAL
  Filled 2022-03-01: qty 1

## 2022-03-01 MED ORDER — ROSUVASTATIN CALCIUM 40 MG PO TABS: 40.0000 mg | ORAL_TABLET | Freq: Every day | ORAL | 2 refills | Status: DC

## 2022-03-01 MED ORDER — CLOPIDOGREL BISULFATE 75 MG PO TABS: 75.0000 mg | ORAL_TABLET | Freq: Every day | ORAL | 2 refills | Status: AC

## 2022-03-01 MED ORDER — ASPIRIN 81 MG PO CHEW: 81.0000 mg | CHEWABLE_TABLET | Freq: Every day | ORAL | 2 refills | Status: AC

## 2022-03-01 NOTE — Progress Notes (Signed)
S: Patient feeling well this AM, no neurologic deficits.  ? ?Data: ? ?MRI brain wwo showed no acute infarct or other acute abnl ? ?MRA neck for further evaluation of mild irregularity of R V1 segment favors this to be simply atherosclerosis and not a true filling defect. ? ?CNS imaging personally reviewed ? ?rEEG showed mild diffuse slowing and focal slowing over her known remote R MCA infarct, no epileptiform abnl ? ?CTA head - severe stenosis L M2 ? ?Ammonia recheck was 17 - value of 154 on admission was lab error ? ?Stroke Labs ? ?   ?Component Value Date/Time  ? CHOL 199 02/28/2022 1725  ? TRIG 53 02/28/2022 1725  ? HDL 52 02/28/2022 1725  ? CHOLHDL 3.8 02/28/2022 1725  ? VLDL 11 02/28/2022 1725  ? LDLCALC 136 (H) 02/28/2022 1725  ? ? ?Lab Results  ?Component Value Date/Time  ? HGBA1C 6.9 (H) 02/28/2022 05:23 PM  ? ? ? ?Vitals:  ? 03/01/22 4742 03/01/22 1113  ?BP: (!) 160/67 (!) 155/78  ?Pulse: 81 85  ?Resp: 20 16  ?Temp: 98.5 ?F (36.9 ?C) 98.8 ?F (37.1 ?C)  ?SpO2: 95% 94%  ? ? ?Physical Exam ?Gen: A&Ox4, NAD ?HEENT: Atraumatic, normocephalic; oropharynx clear, tongue without atrophy or fasciculations. ?Resp: CTAB, normal work of breathing ?CV: RRR, extremities appear well-perfused. ?Abd: soft/NT/ND ?Extrem: Nml bulk; no cyanosis, clubbing, or edema. ? ?Neuro: ?*MS: A&O x4. Follows multi-step commands.  ?*Speech: no dysarthria or aphasia, able to name and repeat. ?*CN:  ?  I: Deferred ?  II,III: PERRLA, VFF by confrontation, optic discs not visualized 2/2 pupillary constriction ?  III,IV,VI: EOMI w/o nystagmus, no ptosis ?  V: Sensation intact from V1 to V3 to LT ?  VII: Eyelid closure was full.  Smile symmetric. ?  VIII: Hearing intact to voice ?  IX,X: Voice normal, palate elevates symmetrically  ?  XI: SCM/trap 5/5 bilat   ?XII: Tongue protrudes midline, no atrophy or fasciculations  ?*Motor:   Normal bulk.  No tremor, rigidity or bradykinesia. No pronator drift. ? ? Strength: Dlt Bic Tri WE WrF FgS Gr HF KnF  KnE PlF DoF  ?  Left '5 5 5 5 5 5 5 5 5 5 5 5  '$ ?  Right '5 5 5 5 5 5 5 5 5 5 5 5  '$ ? ?*Sensory: Intact to light touch, pinprick, temperature vibration throughout. Symmetric. Propioception intact bilat.  No double-simultaneous extinction.  ?*Coordination:  Finger-to-nose, heel-to-shin, rapid alternating motions were intact. ?*Reflexes:  2+ and symmetric throughout without clonus; toes down-going bilat ?*Gait: deferred ? ?NIHSS = 0 ? ? ?A/P: Diane Tate is a 76 y.o. 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 Based on her complete resolution to baseline I favor this incident to have been a TIA. ? ?- Goal normotension, avoid hypotension ?- F/u TTE ?- Patient has listed allergy to atorvastatin - please check if patient is able to tolerate other statins or is willing to try and order prior to discharge ?- ASA '81mg'$  daily + plavix '75mg'$  daily x90 days f/b ASA '81mg'$  daily monotherapy after that (90 days 2/2 severe intracranial stenosis) ?- Patient may f/u with established outpatient neurologist at [facility] ? ?Su Monks, MD ?[facility] ?[phone] ? ?If 7pm- 7am, please page neurology on call as listed in  AMION. ? ?

## 2022-03-01 NOTE — Progress Notes (Signed)
MRI called to notified this RN pt stated she is claustrophobic and requested something for anxiety. MRI to send pt back to the floor until order is obtained. MD paged, awaiting return call.  ?

## 2022-03-01 NOTE — Care Management Obs Status (Signed)
MEDICARE OBSERVATION STATUS NOTIFICATION ? ? ?Patient Details  ?Name: Diane Tate ?MRN: [id number] ?Date of Birth: October 21, 1946 ? ? ?Medicare Observation Status Notification Given:  Yes ? ? ? ?Carles Collet, RN ?03/01/2022, 3:51 PM ?

## 2022-03-01 NOTE — Discharge Summary (Signed)
? ?Name: Diane Tate ?MRN: [id number] ?DOB: [birth year] 76 y.o. ?PCP: Nickola Major, MD ? ?Date of Admission: 02/28/2022  1:18 PM ?Date of Discharge: No discharge date for patient encounter. ?Attending Physician: Angelica Pou, MD ? ?Discharge Diagnosis: ?1. Acute encephalopathy suspected to be 2/2 TIA ?2. Hx of CVA 2/2 right ICA stenosis s/p CEA ?3.  Hypertension ?4.  Insulin-dependent diabetes mellitus ?5.  Rheumatoid arthritis ?6.  Hypothyroidism ?7.  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 ?Discharge Medications: ?Allergies as of 03/01/2022   ? ?   Reactions  ? Latex Hives, Itching  ? Penicillins Hives  ? Cheese Other (See Comments)  ? Causes nose to become itchy   ? Atorvastatin Other (See Comments)  ? Hair loss   ? Lisinopril Cough, Other (See Comments)  ? ?  ? ?  ?Medication List  ?  ? ?STOP taking these medications   ? ?Lantus SoloStar 100 UNIT/ML Solostar Pen ?Generic drug: insulin glargine ?  ? ?  ? ?TAKE these medications   ? ?acetaminophen 500 MG tablet ?Commonly known as: TYLENOL ?Take 1,000 mg by mouth every 6 (six) hours as needed for mild pain. ?  ?ARTIFICIAL TEAR OP ?Place 1 drop into both eyes daily as needed (dry eye). ?  ?aspirin 81 MG chewable tablet ?Chew 1 tablet (81 mg total) by mouth daily. ?Start taking on: March 02, 2022 ?  ?clopidogrel 75 MG tablet ?Commonly known as: PLAVIX ?Take 1 tablet (75 mg total) by mouth daily. ?Start taking on: March 02, 2022 ?  ?escitalopram 20 MG tablet ?Commonly known as: LEXAPRO ?Take 20 mg by mouth daily. ?  ?fluticasone 50 MCG/ACT nasal spray ?Commonly known as: FLONASE ?Place 1 spray into both nostrils daily. ?  ?ibuprofen 200 MG tablet ?Commonly known as: ADVIL ?Take 400 mg by mouth every 6 (six) hours as needed for mild pain. ?  ?leflunomide 10 MG tablet ?Commonly known as: ARAVA ?Take 20 mg by mouth daily. ?  ?levothyroxine 75 MCG  tablet ?Commonly known as: SYNTHROID ?Take 75 mcg by mouth daily. ?  ?losartan 25 MG tablet ?Commonly known as: COZAAR ?Take 25 mg by mouth daily. ?  ?melatonin 5 MG Tabs ?Take 5 mg by mouth at bedtime. ?  ?multivitamins ther. w/minerals Tabs tablet ?Take 1 tablet by mouth daily. ?  ?NOVOLOG FLEXPEN [place] ?Inject 30 Units into the skin 3 (three) times daily before meals. ?  ?rosuvastatin 40 MG tablet ?Commonly known as: Crestor ?Take 1 tablet (40 mg total) by mouth daily. ?  ?Tyler Aas FlexTouch 100 UNIT/ML FlexTouch Pen ?Generic drug: insulin degludec ?Inject 30 Units into the skin daily. ?  ?valACYclovir 500 MG tablet ?Commonly known as: VALTREX ?Take 500 mg by mouth daily. ?  ? ?  ? ? ?Disposition and follow-up:   ?Ms.Diane Tate was discharged from [facility] in Stable condition.  At the hospital follow up visit please address: ? ?Acute encephalopathy suspected to be 2/2 TIA ?- Patient continues ASA 81 mg, Plavix 75 mg ?- Patient continues Crestor 40 mg daily ?- Patient follows up with neurologist at [facility] ? ?Hypertension ?- Continues home losartan 25 mg daily ?- Continue to monitor vitals in outpatient setting ? ?Insulin-dependent diabetes mellitus ?-Continue home Tresiba 30 units daily ?- Continue home NovoLog 30 units 3 times daily with meals ? ?Rheumatoid arthritis ?- Continue home leflunomide 20 mg daily ? ?  Hypothyroidism ?-Continue Synthroid 75 mcg daily ? 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 ? ?2.  Labs / imaging needed at time of follow-up: None ? ?3.  Pending labs/ test needing follow-up: None ? ?Follow-up Appointments: ?Neurology ? ? ?Hospital Course by problem list: ?1. Acute encephalopathy suspected to be 2/2 TIA ?- Patient presented with complaints of confusion and facial drooping with slurred speech. Concern for stoke and code stroke was called. Head CT was obtained which showed no acute abnormalities. MRI felt necessary to rule out stroke given  her hx, however, she required a dose of ativan for claustrophobia. 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 Neurology evaluated the patient and recommended she be started on treatment for suspected TIA. During evaluation today, she showed excellent concentration normal speech and otherwise normal neurologic testing. ECHO obtained, but low concern for cardiac causes at this point. She will be discharged with asa, plavix, and crestor. Instructed to follow up with neurology at [facility].   ?2. Hx of CVA 2/2 right ICA stenosis s/p CEA ?3.  Hypertension ?4.  Insulin-dependent diabetes mellitus ?5.  Rheumatoid arthritis ?6.  Hypothyroidism ?7.  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 ?Discharge Exam:   ?BP (!) 160/75 (BP Location: Left Arm)   Pulse 85   Temp 98.7 ?F (37.1 ?C) (Oral)   Resp 14   Wt 83.4 kg   SpO2 95%  ?Discharge exam:  ?Physical Exam ?Constitutional:   ?   Appearance: Normal appearance. She is normal weight.  ?HENT:  ?   Head: Normocephalic and atraumatic.  ?Eyes:  ?   Extraocular Movements: Extraocular movements intact.  ?   Pupils: Pupils are equal, round, and reactive to light.  ?Cardiovascular:  ?   Rate and Rhythm: Normal rate and regular rhythm.  ?   Heart sounds: Normal heart sounds.  ?Pulmonary:  ?   Effort: Pulmonary effort is normal.  ?   Breath sounds: Normal breath sounds.  ?Abdominal:  ?   General: Abdomen is flat.  ?   Palpations: Abdomen is soft.  ?Musculoskeletal:     ?   General: Normal range of motion.  ?   Cervical back: Normal range of motion and neck supple.  ?Skin: ?   General: Skin is warm and dry.  ?Neurological:  ?   General: No focal deficit present.  ?   Mental Status: She is alert and oriented to person, place, and time. Mental status is at baseline.  ?Psychiatric:     ?   Mood and Affect: Mood normal.     ?   Behavior: Behavior  normal.     ?   Thought Content: Thought content normal.     ?   Judgment: Judgment normal.  ?  ? ?Pertinent Labs, Studies, and Procedures:  ?CTA, MRI, ECHO, EKG ? ?Discharge Instructions: ?instructed to continue aspirin, plavix, crestor and follow up with neurologist. Also instructed to continue other home medicines.  ? ?Signed: ?Delene Ruffini, MD ?PGY1 ?[phone] ?

## 2022-03-01 NOTE — Progress Notes (Incomplete)
? ? ?HD#0 ?SUBJECTIVE:  ?Patient Summary: Diane Tate is a 76 y.o. with a pertinent PMH of ***, who presented with *** and admitted for ***.  ? ?Overnight Events: *** ? ?  ?Interm History: *** ? ?OBJECTIVE:  ?Vital Signs: ?Vitals:  ? 02/28/22 1845 02/28/22 2005 02/28/22 2301 03/01/22 0333  ?BP: (!) 185/80 140/74 (!) 169/68 (!) 177/81  ?Pulse: 82 79 87 86  ?Resp: '17 19 18   '$ ?Temp:  98.4 ?F (36.9 ?C) 98.5 ?F (36.9 ?C) 98.3 ?F (36.8 ?C)  ?TempSrc:  Oral Oral Oral  ?SpO2: 98% 95% 95% 92%  ?Weight:      ? ?Supplemental O2: {NAMES:3044014::"Room Air","Nasal Cannula","Simple Face Mask","Partial Rebreather","HFNC","Non Rebreather","Venturi Mask","Bag Valve Mask"} ?SpO2: 92 % ? ?Filed Weights  ? 02/28/22 1300  ?Weight: 83.4 kg  ? ? ?No intake or output data in the 24 hours ending 03/01/22 0644 ?Net IO Since Admission: No IO data has been entered for this period [03/01/22 0644] ? ?Physical Exam: ?Physical Exam ? ?Patient Lines/Drains/Airways Status   ? ? Active Line/Drains/Airways   ? ? Name Placement date Placement time Site Days  ? Peripheral IV 02/28/22 20 G Left Antecubital 02/28/22  1345  Antecubital  1  ? ?  ?  ? ?  ? ? ?Pertinent Labs: ? ?  Latest Ref Rng & Units 02/28/2022  ?  1:18 PM 02/28/2022  ?  1:13 PM  ?CBC  ?WBC 4.0 - 10.5 K/uL  8.8    ?Hemoglobin 12.0 - 15.0 g/dL 14.3   14.1    ?Hematocrit 36.0 - 46.0 % 42.0   41.5    ?Platelets 150 - 400 K/uL  362    ? ? ? ?  Latest Ref Rng & Units 03/01/2022  ?  2:53 AM 02/28/2022  ?  1:18 PM 02/28/2022  ?  1:13 PM  ?CMP  ?Glucose 70 - 99 mg/dL 127   107   111    ?BUN 8 - 23 mg/dL '14   18   16    '$ ?Creatinine 0.44 - 1.00 mg/dL 0.85   0.90   0.90    ?Sodium 135 - 145 mmol/L 134   135   134    ?Potassium 3.5 - 5.1 mmol/L 3.6   3.7   3.7    ?Chloride 98 - 111 mmol/L 103   101   99    ?CO2 22 - 32 mmol/L 23    24    ?Calcium 8.9 - 10.3 mg/dL 8.6    9.1    ?Total Protein 6.5 - 8.1 g/dL 6.0    7.1    ?Total Bilirubin 0.3 - 1.2 mg/dL 0.7    0.8    ?Alkaline Phos 38 - 126 U/L 73     87    ?AST 15 - 41 U/L 23    26    ?ALT 0 - 44 U/L 21    24    ? ? ?No results for input(s): GLUCAP in the last 72 hours.  ? ?Pertinent Imaging: ?DG Chest 1 View ? ?Result Date: 02/28/2022 ?CLINICAL DATA:  Code stroke, slurred and slow speech. EXAM: CHEST  1 VIEW COMPARISON:  10/15/2021. FINDINGS: The heart size and mediastinal contours are within normal limits. There is mild atherosclerotic calcification of the aorta. Lung volumes are low there is elevation of the right diaphragm with mild atelectasis at the lung bases. No effusion or pneumothorax. No acute osseous abnormality. IMPRESSION: Mild atelectasis at the lung bases. Electronically Signed  By: Brett Fairy M.D.   On: 02/28/2022 21:00  ? ?MR ANGIO NECK W WO CONTRAST ? ?Result Date: 03/01/2022 ?CLINICAL DATA:  76 year old female code stroke presentation. CTA negative for large vessel occlusion but questionable small filling defect in the right vertebral V1 segment. EXAM: MRI HEAD WITHOUT AND WITH CONTRAST MRA NECK WITHOUT CONTRAST TECHNIQUE: Multiplanar, multiecho pulse sequences of the brain and surrounding structures were obtained without and with intravenous contrast. Angiographic images of the neck were obtained using MRA technique without intravenous contrast. Carotid stenosis measurements (when applicable) are obtained utilizing NASCET criteria, using the distal internal carotid diameter as the denominator. CONTRAST:  8.76m GADAVIST GADOBUTROL 1 MMOL/ML IV SOLN COMPARISON:  Brain MRI 05/17/2017. CT head and CTA head and neck yesterday. FINDINGS: MRI HEAD FINDINGS Brain: Chronic cortical encephalomalacia in the right middle frontal gyrus, in the posterior right parietal lobe, and and along the right parietooccipital sulcus laterally is new since 2018. Hemosiderin associated at the latter. Mild susceptibility artifact there on DWI. No restricted diffusion to suggest acute infarction. No midline shift, mass effect, evidence of mass lesion, ventriculomegaly,  extra-axial collection or acute intracranial hemorrhage. Cervicomedullary junction and pituitary are within normal limits. Additional Patchy and confluent bilateral cerebral white matter T2 and FLAIR hyperintensity, with chronic white matter lacunar infarct with cystic encephalomalacia of the right corona radiata. Deep gray matter nuclei relatively spared. Brainstem and cerebellum within normal limits. No other chronic cerebral blood products. No abnormal enhancement identified. No dural thickening. Vascular: Major intracranial vascular flow voids are stable since 2018. the major dural venous sinuses are enhancing and appear to be patent. Skull and upper cervical spine: Visible cervical spine is negative for age. Visualized bone marrow signal is within normal limits. Sinuses/Orbits: Stable since 2018 and negative. Other: Mastoids remain clear. Visible internal auditory structures appear normal. MRA HEAD FINDINGS Precontrast time-of-flight images demonstrate antegrade flow in the bilateral cervical carotid and vertebral arteries from the thoracic inlet to the skull base. No flow gaps to suggest hemodynamically significant stenosis. The left vertebral artery appears dominant. Unfortunately, for the postcontrast portion of the Neck MRA there was a scanner malfunction, and the neck was not imaged as planned. (Rather some coronal venous contaminated MRA images of the head and skull and the upper neck were obtained series 12). Proximal right vertebral artery therefore not evaluated postcontrast. Irregularity of the vessel on the time-of-flight images resembles that on the CTA yesterday and I favor this is mild atherosclerotic irregularity rather than a filling defect. The left vertebral artery appears to be dominant and arises directly from the arch. IMPRESSION: 1. No acute intracranial abnormality. 2. Progressed ischemic disease since 2018. 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 Chronic  hemosiderin at the latter. Lesser underlying small vessel ischemia. 3. Scanner malfunction during attempted postcontrast Neck MRA. Only time-of-flight Neck MRA images obtained. But favor that mild irregularity of the right V1 segment is simply atherosclerosis rather than a filling defect within the vessel. Electronically Signed   By: HGenevie AnnM.D.   On: 03/01/2022 06:21  ? ?MR BRAIN W WO CONTRAST ? ?Result Date: 03/01/2022 ?CLINICAL DATA:  76year old female code stroke presentation. CTA negative for large vessel occlusion but questionable small filling defect in the right vertebral V1 segment. EXAM: MRI HEAD WITHOUT AND WITH CONTRAST MRA NECK WITHOUT CONTRAST TECHNIQUE: Multiplanar, multiecho pulse sequences of the brain and surrounding structures were obtained without and with intravenous contrast. Angiographic images of the neck were obtained using MRA  technique without intravenous contrast. Carotid stenosis measurements (when applicable) are obtained utilizing NASCET criteria, using the distal internal carotid diameter as the denominator. CONTRAST:  8.70m GADAVIST GADOBUTROL 1 MMOL/ML IV SOLN COMPARISON:  Brain MRI 05/17/2017. CT head and CTA head and neck yesterday. FINDINGS: MRI HEAD FINDINGS Brain: Chronic cortical encephalomalacia in the right middle frontal gyrus, in the posterior right parietal lobe, and and along the right parietooccipital sulcus laterally is new since 2018. Hemosiderin associated at the latter. Mild susceptibility artifact there on DWI. No restricted diffusion to suggest acute infarction. No midline shift, mass effect, evidence of mass lesion, ventriculomegaly, extra-axial collection or acute intracranial hemorrhage. Cervicomedullary junction and pituitary are within normal limits. Additional Patchy and confluent bilateral cerebral white matter T2 and FLAIR hyperintensity, with chronic white matter lacunar infarct with cystic encephalomalacia of the right corona radiata. Deep gray matter  nuclei relatively spared. Brainstem and cerebellum within normal limits. No other chronic cerebral blood products. No abnormal enhancement identified. No dural thickening. Vascular: Major intracranial vascular f

## 2022-03-01 NOTE — Plan of Care (Signed)
?  Problem: Education: Goal: Knowledge of disease or condition will improve Outcome: Progressing Goal: Knowledge of secondary prevention will improve (SELECT ALL) Outcome: Progressing Goal: Knowledge of patient specific risk factors will improve (INDIVIDUALIZE FOR PATIENT) Outcome: Progressing   Problem: Coping: Goal: Will verbalize positive feelings about self Outcome: Progressing Goal: Will identify appropriate support needs Outcome: Progressing   

## 2022-03-01 NOTE — Progress Notes (Signed)
?  Echocardiogram ?2D Echocardiogram has been performed. ? ?Diane Tate ?03/01/2022, 4:59 PM ?

## 2022-03-01 NOTE — Discharge Instructions (Addendum)
Dear Mrs. Erb,  ? ?Thank you for trusting [place] with your care today.  ? ?We assessed your for a change in your thinking.  ?The neurologist evaluated you. ? ?Please ensure that you take aspirin '81mg'$  daily Please also take '75mg'$  Plavix daily.  ?We would like for you to take '40mg'$  of Crestor daily.  ? ?Plese also take the Losartan '25mg'$  for your blood pressure. ? ?Continue with your home Tresiba 30 units and Novolog 30 units three times daily with food.  ? ?Please take your leflunomide and synthroid as well.  ? ?Ensure that you follow up with a neurologist at [facility].  ?

## 2022-03-03 LAB — GLUCOSE, CAPILLARY: Glucose-Capillary: 108 mg/dL — ABNORMAL HIGH (ref 70–99)

## 2022-03-04 ENCOUNTER — Telehealth: Payer: Self-pay | Admitting: *Deleted

## 2022-03-04 NOTE — Telephone Encounter (Signed)
Call from patient unsure as to why she was told to stop  taking her Lantus.  Has been unable to contact her Endocrinologist or her PCP.  Just wants to know whay and what she should do until she can follow up with her Primary and the Endocrinologist.   ?

## 2022-03-05 NOTE — Telephone Encounter (Signed)
Please call pt back about her insulin.  ?

## 2022-05-23 DIAGNOSIS — G4733 Obstructive sleep apnea (adult) (pediatric): Secondary | ICD-10-CM | POA: Insufficient documentation

## 2022-07-10 ENCOUNTER — Other Ambulatory Visit: Payer: Self-pay | Admitting: Family Medicine

## 2022-07-10 DIAGNOSIS — Z1231 Encounter for screening mammogram for malignant neoplasm of breast: Secondary | ICD-10-CM

## 2022-08-25 ENCOUNTER — Ambulatory Visit: Payer: Medicare Other

## 2022-09-22 ENCOUNTER — Ambulatory Visit
Admission: RE | Admit: 2022-09-22 | Discharge: 2022-09-22 | Disposition: A | Discharge status: Home / Self Care | Payer: Medicare Other | Source: Ambulatory Visit | Attending: Family Medicine | Admitting: Family Medicine

## 2022-09-22 DIAGNOSIS — Z1231 Encounter for screening mammogram for malignant neoplasm of breast: Secondary | ICD-10-CM

## 2022-09-26 DIAGNOSIS — H353191 Nonexudative age-related macular degeneration, unspecified eye, early dry stage: Secondary | ICD-10-CM | POA: Insufficient documentation

## 2023-01-29 DIAGNOSIS — E1165 Type 2 diabetes mellitus with hyperglycemia: Secondary | ICD-10-CM | POA: Insufficient documentation

## 2023-03-25 DIAGNOSIS — Z79899 Other long term (current) drug therapy: Secondary | ICD-10-CM | POA: Insufficient documentation

## 2023-08-17 ENCOUNTER — Other Ambulatory Visit: Payer: Self-pay | Admitting: Family Medicine

## 2023-08-17 DIAGNOSIS — Z1231 Encounter for screening mammogram for malignant neoplasm of breast: Secondary | ICD-10-CM

## 2023-09-25 ENCOUNTER — Ambulatory Visit
Admission: RE | Admit: 2023-09-25 | Discharge: 2023-09-25 | Disposition: A | Discharge status: Home / Self Care | Payer: Medicare Other | Source: Ambulatory Visit | Attending: Family Medicine | Admitting: Family Medicine

## 2023-09-25 DIAGNOSIS — Z1231 Encounter for screening mammogram for malignant neoplasm of breast: Secondary | ICD-10-CM

## 2024-02-06 IMAGING — MR MR MRA NECK WO/W CM
3 series · 38 of 48 positions shown · IV contrast (Gadavist)
Comparison: Brain MRI 05/17/2017.

CT head and CTA head and neck yesterday.

CLINICAL DATA: 75-year-old female code stroke presentation. CTA
negative for large vessel occlusion but questionable small filling
defect in the right vertebral V1 segment.

EXAM:
MRI HEAD WITHOUT AND WITH CONTRAST
MRA NECK WITHOUT CONTRAST
TECHNIQUE: Multiplanar, multiecho pulse sequences of the brain and surrounding
structures were obtained without and with intravenous contrast.

[Series 7: tof_fl3d_tra_iso · axial · 0.6mm · 0.52mm/px · z∈[-254,-76]mm · 22 of 310 slices shown]
[im 1/310]
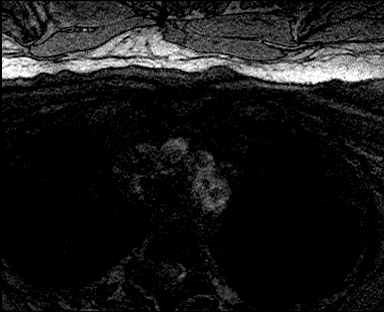
[im 10/310]
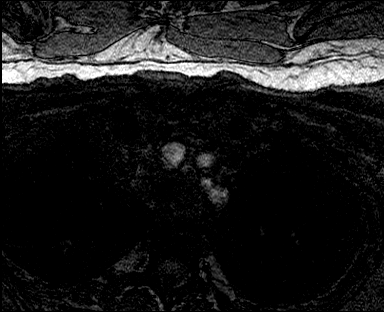
[im 20/310]
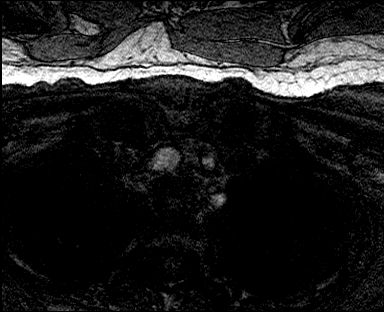
[im 30/310]
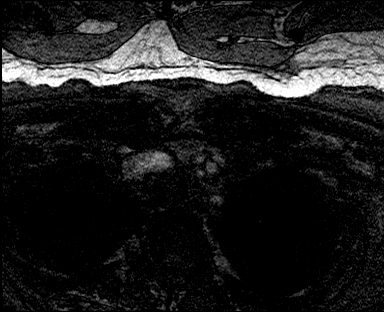
[im 40/310]
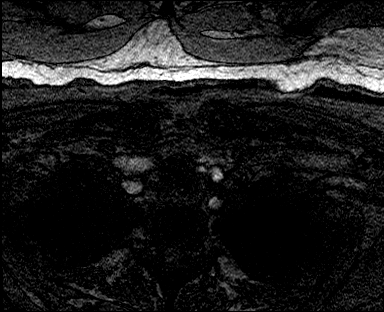
[im 50/310]
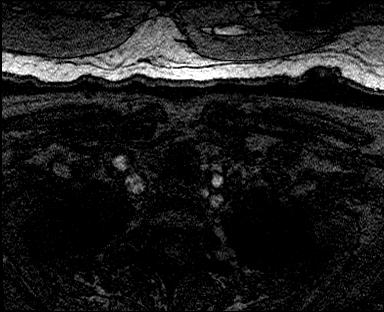
[im 60/310]
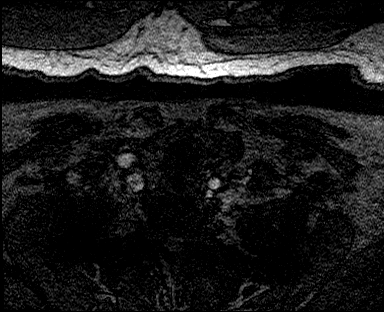
[im 70/310]
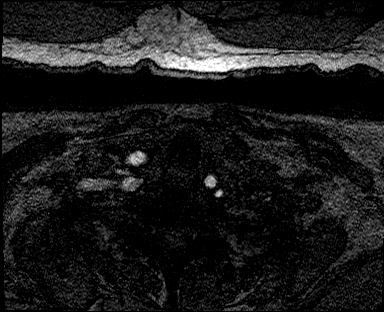
[im 80/310]
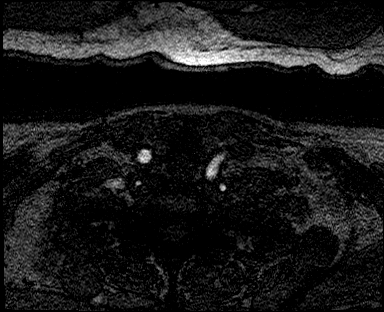
[im 90/310]
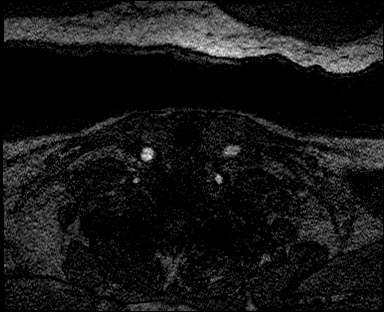
[im 100/310]
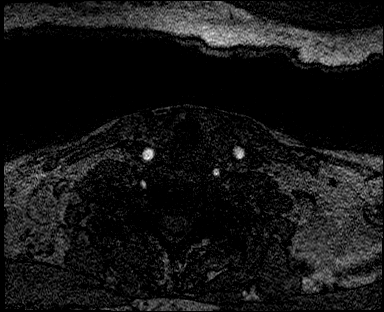
[im 110/310]
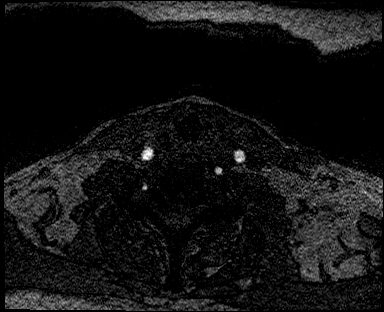
[im 120/310]
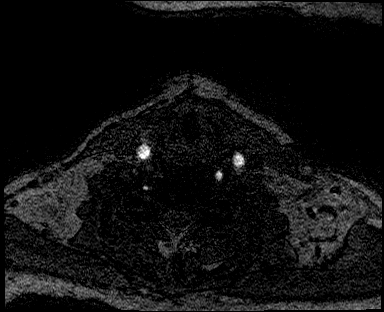
[im 130/310]
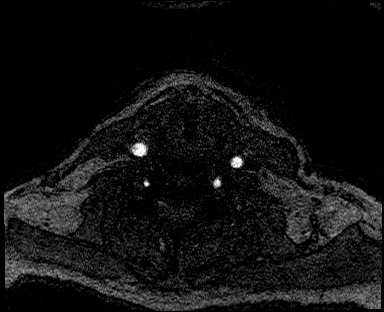
[im 140/310]
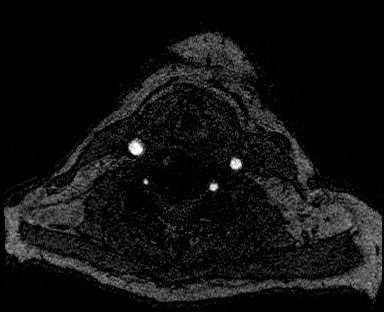
[im 150/310]
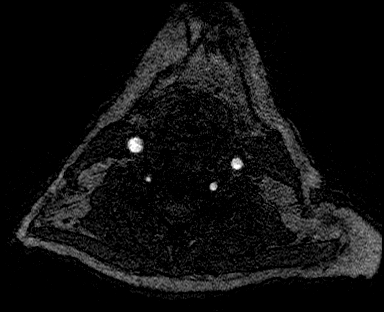
[im 160/310]
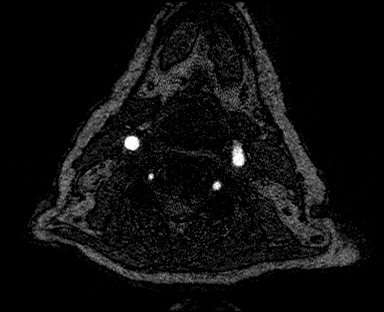
[im 170/310]
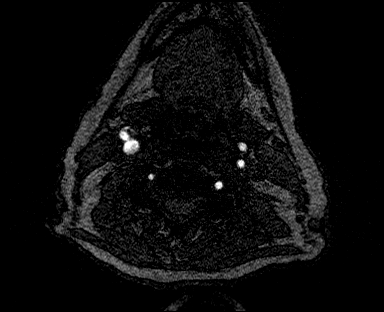
[im 180/310]
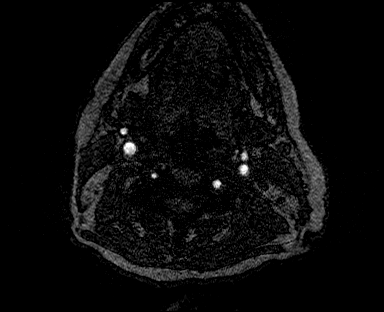
[im 220/310]
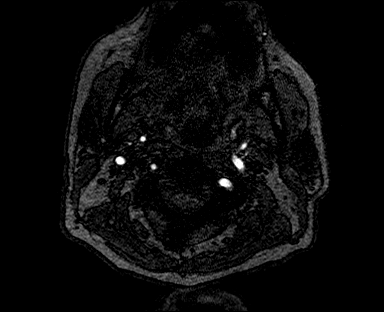
[im 260/310]
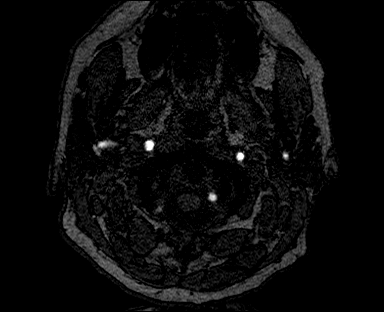
[im 300/310]
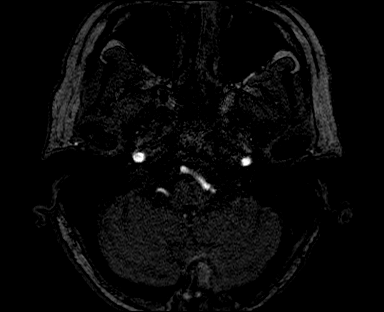

[Series 10: angio_fl3d_cor_pre_ttc=3.0s · coronal · 0.9mm · 0.85mm/px · 8 of 80 slices shown]
[im 1/80]
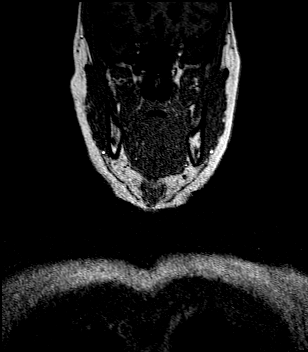
[im 12/80]
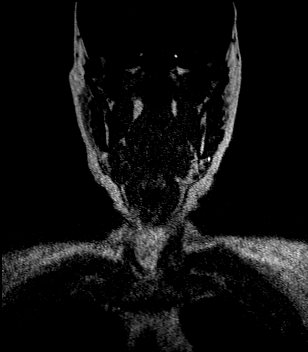
[im 23/80]
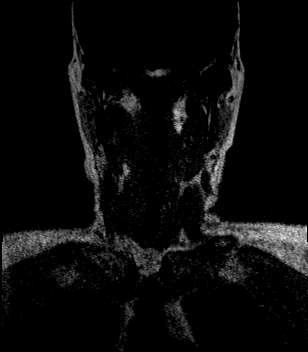
[im 34/80]
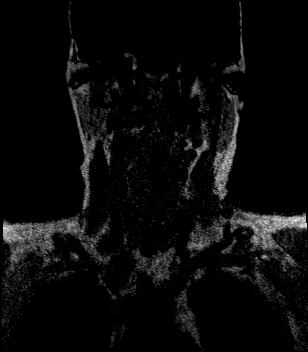
[im 46/80]
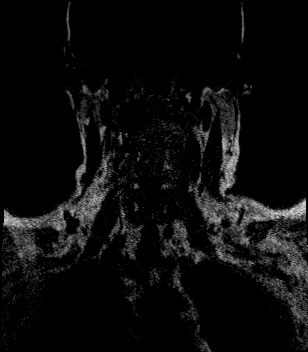
[im 57/80]
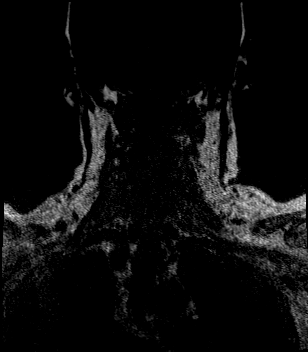
[im 68/80]
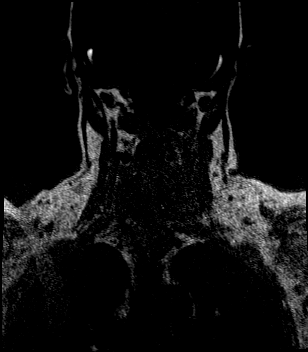
[im 80/80]
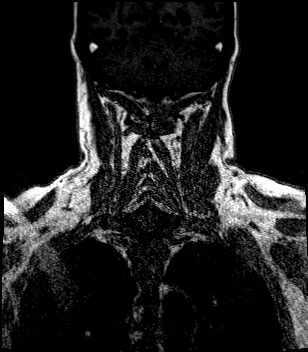

[Series 12: angio_fl3d_cor_post_ttc=3.0s · coronal · 0.9mm · 0.85mm/px · 8 of 80 slices shown]
[im 1/80]
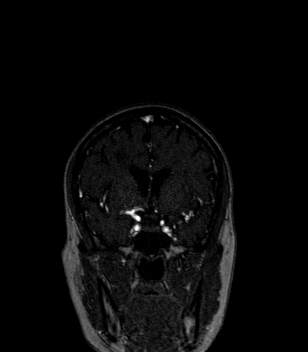
[im 12/80]
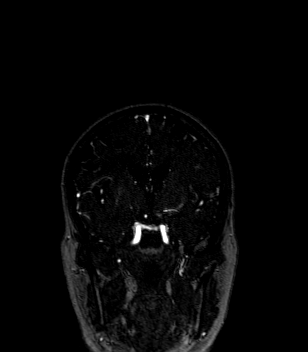
[im 23/80]
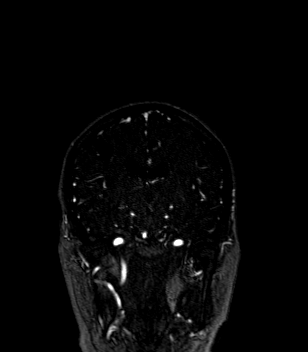
[im 34/80]
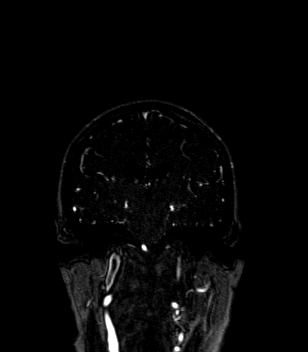
[im 46/80]
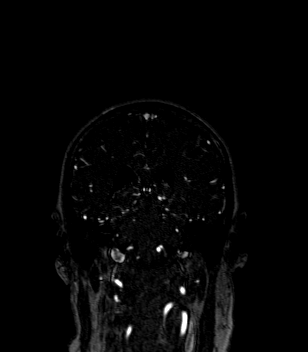
[im 57/80]
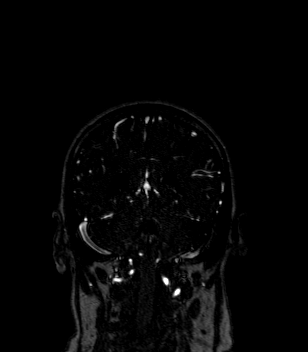
[im 68/80]
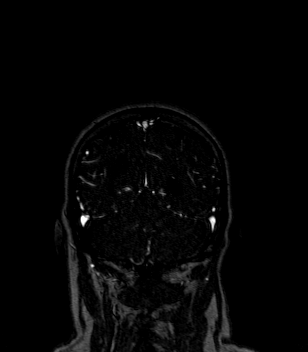
[im 80/80]
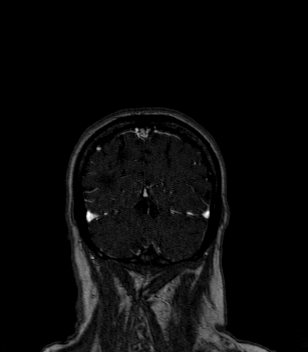

[38 of 48 positions shown; findings below may reference images not displayed]

Angiographic images of the neck were obtained using MRA technique
without intravenous contrast. Carotid stenosis measurements (when
applicable) are obtained utilizing NASCET criteria, using the distal
internal carotid diameter as the denominator.

CONTRAST:  8.5mL GADAVIST GADOBUTROL 1 MMOL/ML IV SOLN
FINDINGS: MRI HEAD FINDINGS

Brain: Chronic cortical encephalomalacia in the right middle frontal
gyrus, in the posterior right parietal lobe, and and along the right
parietooccipital sulcus laterally is new since 1667. Hemosiderin
associated at the latter. Mild susceptibility artifact there on DWI.

No restricted diffusion to suggest acute infarction. No midline
shift, mass effect, evidence of mass lesion, ventriculomegaly,
extra-axial collection or acute intracranial hemorrhage.
Cervicomedullary junction and pituitary are within normal limits.

Additional Patchy and confluent bilateral cerebral white matter T2
and FLAIR hyperintensity, with chronic white matter lacunar infarct
with cystic encephalomalacia of the right corona radiata. Deep gray
matter nuclei relatively spared. Brainstem and cerebellum within
normal limits. No other chronic cerebral blood products.

No abnormal enhancement identified. No dural thickening.

Vascular: Major intracranial vascular flow voids are stable since
1667. the major dural venous sinuses are enhancing and appear to be
patent.

Skull and upper cervical spine: Visible cervical spine is negative
for age. Visualized bone marrow signal is within normal limits.

Sinuses/Orbits: Stable since 1667 and negative.

Other: Mastoids remain clear. Visible internal auditory structures
appear normal.

MRA HEAD FINDINGS

Precontrast time-of-flight images demonstrate antegrade flow in the
bilateral cervical carotid and vertebral arteries from the thoracic
inlet to the skull base. No flow gaps to suggest hemodynamically
significant stenosis. The left vertebral artery appears dominant.

Unfortunately, for the postcontrast portion of the Neck MRA there
was a scanner malfunction, and the neck was not imaged as planned.
(Rather some coronal venous contaminated MRA images of the head and
skull and the upper neck were obtained series 12).

Proximal right vertebral artery therefore not evaluated
postcontrast. Irregularity of the vessel on the time-of-flight
images resembles that on the CTA yesterday and I favor this is mild
atherosclerotic irregularity rather than a filling defect. The left
vertebral artery appears to be dominant and arises directly from the
arch.
IMPRESSION: 1. No acute intracranial abnormality.

2. Progressed ischemic disease since 1667. Chronic cortical infarcts
in the right MCA and PCA territories. Chronic hemosiderin at the
latter. Lesser underlying small vessel ischemia.

3. Scanner malfunction during attempted postcontrast Neck MRA. Only
time-of-flight Neck MRA images obtained. But favor that mild
irregularity of the right V1 segment is simply atherosclerosis
rather than a filling defect within the vessel.

## 2024-08-22 ENCOUNTER — Other Ambulatory Visit: Payer: Self-pay | Admitting: Family Medicine

## 2024-08-22 DIAGNOSIS — Z0000 Encounter for general adult medical examination without abnormal findings: Secondary | ICD-10-CM

## 2024-08-23 ENCOUNTER — Encounter: Payer: Self-pay | Admitting: Podiatry

## 2024-08-23 ENCOUNTER — Ambulatory Visit (INDEPENDENT_AMBULATORY_CARE_PROVIDER_SITE_OTHER): Admitting: Podiatry

## 2024-08-23 DIAGNOSIS — M79674 Pain in right toe(s): Secondary | ICD-10-CM

## 2024-08-23 DIAGNOSIS — E039 Hypothyroidism, unspecified: Secondary | ICD-10-CM | POA: Insufficient documentation

## 2024-08-23 DIAGNOSIS — Z853 Personal history of malignant neoplasm of breast: Secondary | ICD-10-CM | POA: Insufficient documentation

## 2024-08-23 DIAGNOSIS — M79675 Pain in left toe(s): Secondary | ICD-10-CM | POA: Diagnosis not present

## 2024-08-23 DIAGNOSIS — E1142 Type 2 diabetes mellitus with diabetic polyneuropathy: Secondary | ICD-10-CM

## 2024-08-23 DIAGNOSIS — I10 Essential (primary) hypertension: Secondary | ICD-10-CM | POA: Insufficient documentation

## 2024-08-23 DIAGNOSIS — B351 Tinea unguium: Secondary | ICD-10-CM | POA: Diagnosis not present

## 2024-08-23 NOTE — Progress Notes (Signed)
 Subjective:  Patient ID: Diane Tate, female    DOB: 1946/03/20,  MRN: [id number] HPI Chief Complaint  Patient presents with   Diabetes    Requesting diabetic foot exam - hammertoe 2nd left - developed a blister that got very sore and red, but it is better today and not having an issue with it anymore, concerned about toenails looking brittle and thick, [facility], last A1c 6.9   New Patient (Initial Visit)    78 y.o. female presents with the above complaint.   ROS: Denies fever chills nausea vomiting muscle aches pains calf pain back pain chest pain shortness of breath.  Past Medical History:  Diagnosis Date   Breast cancer (HCC) 1988   Right Breast   Personal history of radiation therapy 1988   Right Breast   Past Surgical History:  Procedure Laterality Date   BREAST LUMPECTOMY Right 1988    Current Outpatient Medications:    MYRBETRIQ 50 MG TB24 tablet, , Disp: , Rfl:    telmisartan (MICARDIS) 20 MG tablet, Take 20 mg by mouth daily., Disp: , Rfl:    ARTIFICIAL TEAR OP, Place 1 drop into both eyes daily as needed (dry eye)., Disp: , Rfl:    DULoxetine (CYMBALTA) 20 MG capsule, Take 20 mg by mouth 2 (two) times daily., Disp: , Rfl:    fluticasone (FLONASE) 50 MCG/ACT nasal spray, Place 1 spray into both nostrils daily., Disp: , Rfl:    ibuprofen (ADVIL) 200 MG tablet, Take 400 mg by mouth every 6 (six) hours as needed for mild pain., Disp: , Rfl:    Insulin  Aspart (NOVOLOG  FLEXPEN [place]), Inject 30 Units into the skin 3 (three) times daily before meals., Disp: , Rfl:    insulin  degludec (TRESIBA FLEXTOUCH) 100 UNIT/ML FlexTouch Pen, Inject 30 Units into the skin daily., Disp: , Rfl:    LANTUS  SOLOSTAR 100 UNIT/ML Solostar Pen, Inject into the skin., Disp: , Rfl:    leflunomide  (ARAVA ) 10 MG tablet, Take 20 mg by mouth daily., Disp: , Rfl:    levothyroxine  (SYNTHROID ) 75 MCG tablet, Take 75 mcg by mouth daily., Disp: , Rfl:    losartan  (COZAAR ) 25  MG tablet, Take 25 mg by mouth daily., Disp: , Rfl:    melatonin 5 MG TABS, Take 5 mg by mouth at bedtime., Disp: , Rfl:    Multiple Vitamins-Minerals (MULTIVITAMINS THER. W/MINERALS) TABS tablet, Take 1 tablet by mouth daily., Disp: , Rfl:    rosuvastatin  (CRESTOR ) 40 MG tablet, Take 1 tablet (40 mg total) by mouth daily., Disp: 30 tablet, Rfl: 0   valACYclovir (VALTREX) 500 MG tablet, Take 500 mg by mouth daily., Disp: , Rfl:   Allergies  Allergen Reactions   Latex Hives and Itching   Penicillins Hives   Cheese Other (See Comments)    Causes nose to become itchy    Atorvastatin Other (See Comments)    Hair loss     Lisinopril Cough and Other (See Comments)   Review of Systems Objective:  There were no vitals filed for this visit.  General: Well developed, nourished, in no acute distress, alert and oriented x3   Dermatological: Skin is warm, dry and supple bilateral. Nails x 10 are well maintained; remaining integument appears unremarkable at this time. There are no open sores, no preulcerative lesions, no rash or signs of infection present.  No open lesions or wounds.  Hallux nails do demonstrate some nail dystrophy but no fungus.  Vascular: Dorsalis Pedis artery  and Posterior Tibial artery pedal pulses are 2/4 bilateral with immedate capillary fill time. Pedal hair growth present. No varicosities and no lower extremity edema present bilateral.   Neruologic: Grossly intact via light touch bilateral. Vibratory intact via tuning fork bilateral. Protective threshold with Semmes Wienstein monofilament slightly diminished to all pedal sites bilateral. Patellar and Achilles deep tendon reflexes 2+ bilateral. No Babinski or clonus noted bilateral.   Musculoskeletal: She has moderate to severe hammertoe deformities most of these are rigid in nature.  No pain, crepitus, or limitation noted with foot and ankle range of motion bilateral. Muscular strength 5/5 in all groups tested  bilateral.  Gait: Unassisted, Nonantalgic.    Radiographs:  None taken  Assessment & Plan:   Assessment: Nail dystrophy and hammertoe deformity  Plan: Discussed appropriate shoe gear.  Debrided toenails bilaterally.     Jacobey Gura T. [place], [place]

## 2024-09-26 ENCOUNTER — Ambulatory Visit
Admission: RE | Admit: 2024-09-26 | Discharge: 2024-09-26 | Disposition: A | Discharge status: Home / Self Care | Source: Ambulatory Visit | Attending: Family Medicine | Admitting: Family Medicine

## 2024-09-26 DIAGNOSIS — Z0000 Encounter for general adult medical examination without abnormal findings: Secondary | ICD-10-CM

## 2024-09-29 ENCOUNTER — Encounter: Payer: Self-pay | Admitting: Neurology

## 2024-11-22 ENCOUNTER — Ambulatory Visit: Admitting: Podiatry

## 2024-11-22 ENCOUNTER — Encounter: Payer: Self-pay | Admitting: Podiatry

## 2024-11-22 DIAGNOSIS — B351 Tinea unguium: Secondary | ICD-10-CM | POA: Diagnosis not present

## 2024-11-22 DIAGNOSIS — M79674 Pain in right toe(s): Secondary | ICD-10-CM

## 2024-11-22 DIAGNOSIS — M79675 Pain in left toe(s): Secondary | ICD-10-CM | POA: Diagnosis not present

## 2024-11-22 DIAGNOSIS — E1142 Type 2 diabetes mellitus with diabetic polyneuropathy: Secondary | ICD-10-CM | POA: Diagnosis not present

## 2024-11-22 NOTE — Progress Notes (Signed)
 This patient returns to my office for at risk foot care.  This patient requires this care by a professional since this patient will be at risk due to having RA and DM.   This patient is unable to cut nails herself since the patient cannot reach her nails.These nails are painful walking and wearing shoes.  This patient presents for at risk foot care today.  General Appearance  Alert, conversant and in no acute stress.  Vascular  Dorsalis pedis and posterior tibial  pulses are palpable  bilaterally.  Capillary return is within normal limits  bilaterally. Temperature is within normal limits  bilaterally.  Neurologic  Senn-Weinstein monofilament wire test within normal limits  bilaterally. Muscle power within normal limits bilaterally.  Nails Thick disfigured discolored nails with subungual debris  hallux nails bilaterally. No evidence of bacterial infection or drainage bilaterally.  Orthopedic  No limitations of motion  feet .  No crepitus or effusions noted. HAV  B/L.  Hammer toes  B/L.  Skin  normotropic skin with no porokeratosis noted bilaterally.  No signs of infections or ulcers noted.     Onychomycosis  Pain in right toes  Pain in left toes  Consent was obtained for treatment procedures.   Mechanical debridement of nails 1-5  bilaterally performed with a nail nipper.  Filed with dremel without incident. Padding given for 1,2 toes left foot.   Return office visit   10 weeks                   Told patient to return for periodic foot care and evaluation due to potential at risk complications.   Cordella Bold DPM

## 2024-12-08 ENCOUNTER — Other Ambulatory Visit: Payer: Self-pay

## 2024-12-08 ENCOUNTER — Emergency Department (HOSPITAL_BASED_OUTPATIENT_CLINIC_OR_DEPARTMENT_OTHER)
Admission: EM | Admit: 2024-12-08 | Discharge: 2024-12-08 | Disposition: A | Discharge status: Home / Self Care | Attending: Emergency Medicine | Admitting: Emergency Medicine

## 2024-12-08 ENCOUNTER — Encounter (HOSPITAL_BASED_OUTPATIENT_CLINIC_OR_DEPARTMENT_OTHER): Payer: Self-pay

## 2024-12-08 DIAGNOSIS — Z8673 Personal history of transient ischemic attack (TIA), and cerebral infarction without residual deficits: Secondary | ICD-10-CM | POA: Insufficient documentation

## 2024-12-08 DIAGNOSIS — Z79899 Other long term (current) drug therapy: Secondary | ICD-10-CM | POA: Diagnosis not present

## 2024-12-08 DIAGNOSIS — R030 Elevated blood-pressure reading, without diagnosis of hypertension: Secondary | ICD-10-CM

## 2024-12-08 DIAGNOSIS — Z794 Long term (current) use of insulin: Secondary | ICD-10-CM | POA: Diagnosis not present

## 2024-12-08 DIAGNOSIS — Z91040 Latex allergy status: Secondary | ICD-10-CM | POA: Diagnosis not present

## 2024-12-08 DIAGNOSIS — I10 Essential (primary) hypertension: Secondary | ICD-10-CM | POA: Insufficient documentation

## 2024-12-08 LAB — CBG MONITORING, ED: Glucose-Capillary: 192 mg/dL — ABNORMAL HIGH (ref 70–99)

## 2024-12-08 NOTE — Discharge Instructions (Addendum)
 Follow-up with your doctor on Monday as scheduled.  Return to the emergency room if you have any worsening symptoms.

## 2024-12-08 NOTE — ED Notes (Signed)
 Pt reports increased anxiety and stress this week that has possibly caused an increase in her blood pressure.

## 2024-12-08 NOTE — ED Notes (Signed)
 Reviewed AVS/discharge instructions with patient. Time allotted for and all questions answered. Patient is agreeable for d/c and escorted to ED exit by staff.

## 2024-12-08 NOTE — ED Triage Notes (Signed)
 Patient reports noticing her blood pressure has been elevated starting yesterday into today. She says that she has not been having any symptoms of weakness, headache, blurry vision, or chest pain. She says she has had hx of two mini strokes first one being in 2018. She has an appointment with her PA on Monday, but her friend suggested she come here.   Highest BP is 191/100 recorded at home on a wrist blood pressure cuff.   She has been taking her BP medication as prescribed.

## 2024-12-08 NOTE — ED Provider Notes (Signed)
 " [place] EMERGENCY DEPARTMENT AT [facility] Provider Note   CSN: [id number] Arrival date & time: 12/08/24  1744     Patient presents with: Hypertension   Diane Tate is a 79 y.o. female.   Patient is a 79 year old with a history of hypertension who presents with elevated blood pressures.  She said they have been elevated throughout the day.  She has had a prior history of TIAs and was concerned about her blood pressure being elevated.  She is not having any symptoms.  No headache.  No nausea or vomiting.  No vision changes.  No speech deficits.  No numbness or weakness to her extremities.  She does take her blood pressure medicine consistently but sometimes she does miss doses.  She does have an upcoming appointment on Monday to follow-up with her PCP to discuss her blood pressure.       Prior to Admission medications  Medication Sig Start Date End Date Taking? Authorizing Provider  ARTIFICIAL TEAR OP Place 1 drop into both eyes daily as needed (dry eye).    [provider]  DULoxetine (CYMBALTA) 20 MG capsule Take 20 mg by mouth 2 (two) times daily.    [provider]  fluticasone (FLONASE) 50 MCG/ACT nasal spray Place 1 spray into both nostrils daily.    [provider]  ibuprofen (ADVIL) 200 MG tablet Take 400 mg by mouth every 6 (six) hours as needed for mild pain.    [provider]  Insulin  Aspart (NOVOLOG  FLEXPEN [place]) Inject 30 Units into the skin 3 (three) times daily before meals.    [provider]  insulin  degludec (TRESIBA FLEXTOUCH) 100 UNIT/ML FlexTouch Pen Inject 30 Units into the skin daily.    [provider]  LANTUS  SOLOSTAR 100 UNIT/ML Solostar Pen Inject into the skin.    [provider]  leflunomide  (ARAVA ) 10 MG tablet Take 20 mg by mouth daily. 10/14/21   [provider]  levothyroxine  (SYNTHROID ) 75 MCG tablet Take 75 mcg by mouth daily. 11/22/21   [provider]   losartan  (COZAAR ) 25 MG tablet Take 25 mg by mouth daily. 02/22/22   [provider]  melatonin 5 MG TABS Take 5 mg by mouth at bedtime.    [provider]  Multiple Vitamins-Minerals (MULTIVITAMINS THER. W/MINERALS) TABS tablet Take 1 tablet by mouth daily.    [provider]  MYRBETRIQ 50 MG TB24 tablet  05/04/23   [provider]  rosuvastatin  (CRESTOR ) 40 MG tablet Take 1 tablet (40 mg total) by mouth daily. 03/01/22   Gawaluck, Greylon, MD  telmisartan (MICARDIS) 20 MG tablet Take 20 mg by mouth daily. 01/05/23   [provider]  valACYclovir (VALTREX) 500 MG tablet Take 500 mg by mouth daily. 02/07/22   [provider]    Allergies: Latex, Penicillins, Cheese, Atorvastatin, and Lisinopril    Review of Systems  Constitutional:  Negative for chills, diaphoresis, fatigue and fever.  HENT:  Negative for congestion, rhinorrhea and sneezing.   Eyes: Negative.   Respiratory:  Negative for cough, chest tightness and shortness of breath.   Cardiovascular:  Negative for chest pain and leg swelling.  Gastrointestinal:  Negative for abdominal pain, diarrhea, nausea and vomiting.  Genitourinary:  Negative for difficulty urinating, flank pain and frequency.  Musculoskeletal:  Negative for arthralgias and back pain.  Skin:  Negative for rash.  Neurological:  Negative for dizziness, speech difficulty, weakness, numbness and headaches.    Updated Vital Signs  BP (!) 141/57 (BP Location: Right Arm)   Pulse 87   Temp 98 F (36.7 C) (Oral)   Resp 18   SpO2 96%   Physical Exam Constitutional:      Appearance: She is well-developed.  HENT:     Head: Normocephalic and atraumatic.  Eyes:     Pupils: Pupils are equal, round, and reactive to light.  Cardiovascular:     Rate and Rhythm: Normal rate and regular rhythm.     Heart sounds: Normal heart sounds.  Pulmonary:     Effort: Pulmonary effort is normal. No respiratory distress.     Breath  sounds: Normal breath sounds. No wheezing or rales.  Chest:     Chest wall: No tenderness.  Abdominal:     General: Bowel sounds are normal.     Palpations: Abdomen is soft.     Tenderness: There is no abdominal tenderness. There is no guarding or rebound.  Musculoskeletal:        General: Normal range of motion.     Cervical back: Normal range of motion and neck supple.  Lymphadenopathy:     Cervical: No cervical adenopathy.  Skin:    General: Skin is warm and dry.     Findings: No rash.  Neurological:     General: No focal deficit present.     Mental Status: She is alert and oriented to person, place, and time.     (all labs ordered are listed, but only abnormal results are displayed) Labs Reviewed  CBG MONITORING, ED - Abnormal; Notable for the following components:      Result Value   Glucose-Capillary 192 (*)    All other components within normal limits    EKG: EKG Interpretation Date/Time:  Thursday December 08 2024 18:11:48 EST Ventricular Rate:  88 PR Interval:  164 QRS Duration:  83 QT Interval:  389 QTC Calculation: 471 R Axis:   32  Text Interpretation: Sinus rhythm Anteroseptal infarct, old Borderline repolarization abnormality since last tracing no significant change Confirmed by Lenor Hollering [phone]) on 12/08/2024 7:00:30 PM  Radiology: No results found.   Procedures   Medications Ordered in the ED - No data to display                                  Medical Decision Making Problems Addressed: Elevated blood pressure reading: acute illness or injury that poses a threat to life or bodily functions  Amount and/or Complexity of Data Reviewed Independent Historian: spouse External Data Reviewed: notes. ECG/medicine tests: ordered and independent interpretation performed. Decision-making details documented in ED Course.  Risk Decision regarding hospitalization. Risk Details: Patient does not have any clinical concerns for ACS,  stroke/TIA   Patient is 79 year old who presents with elevated blood pressure readings at home.  It has been up to 190-200 systolic at times.  Currently her blood pressure is 141/57.  She is not having any symptoms.  She does not have clinical concerns for stroke.  Her EKG is nonconcerning.  She was discharged home in good condition.  She has an appointment and 4 days to follow-up with her PCP.  Return precautions were given.     Final diagnoses:  Elevated blood pressure reading    ED Discharge Orders     None          Lenor Hollering, MD 12/08/24 1954  "

## 2024-12-20 NOTE — Telephone Encounter (Signed)
 Order submitted via parachute, awaiting provider signature.

## 2024-12-28 ENCOUNTER — Other Ambulatory Visit: Payer: Self-pay

## 2024-12-28 ENCOUNTER — Encounter (HOSPITAL_BASED_OUTPATIENT_CLINIC_OR_DEPARTMENT_OTHER): Payer: Self-pay | Admitting: Cardiovascular Disease

## 2024-12-28 ENCOUNTER — Ambulatory Visit (INDEPENDENT_AMBULATORY_CARE_PROVIDER_SITE_OTHER): Admitting: Cardiovascular Disease

## 2024-12-28 ENCOUNTER — Emergency Department (HOSPITAL_BASED_OUTPATIENT_CLINIC_OR_DEPARTMENT_OTHER)
Admission: EM | Admit: 2024-12-28 | Discharge: 2024-12-28 | Disposition: A | Discharge status: Home / Self Care | Attending: Emergency Medicine | Admitting: Emergency Medicine

## 2024-12-28 VITALS — BP 134/62 | HR 80 | Ht 66.0 in | Wt 175.9 lb

## 2024-12-28 DIAGNOSIS — Z79890 Hormone replacement therapy (postmenopausal): Secondary | ICD-10-CM | POA: Diagnosis not present

## 2024-12-28 DIAGNOSIS — E119 Type 2 diabetes mellitus without complications: Secondary | ICD-10-CM | POA: Diagnosis not present

## 2024-12-28 DIAGNOSIS — R000 Tachycardia, unspecified: Secondary | ICD-10-CM | POA: Diagnosis not present

## 2024-12-28 DIAGNOSIS — E039 Hypothyroidism, unspecified: Secondary | ICD-10-CM | POA: Insufficient documentation

## 2024-12-28 DIAGNOSIS — Z5181 Encounter for therapeutic drug level monitoring: Secondary | ICD-10-CM

## 2024-12-28 DIAGNOSIS — R112 Nausea with vomiting, unspecified: Secondary | ICD-10-CM | POA: Diagnosis present

## 2024-12-28 DIAGNOSIS — Z91040 Latex allergy status: Secondary | ICD-10-CM | POA: Insufficient documentation

## 2024-12-28 DIAGNOSIS — I10 Essential (primary) hypertension: Secondary | ICD-10-CM | POA: Diagnosis not present

## 2024-12-28 DIAGNOSIS — Z853 Personal history of malignant neoplasm of breast: Secondary | ICD-10-CM | POA: Diagnosis not present

## 2024-12-28 DIAGNOSIS — Z8673 Personal history of transient ischemic attack (TIA), and cerebral infarction without residual deficits: Secondary | ICD-10-CM | POA: Insufficient documentation

## 2024-12-28 DIAGNOSIS — Z79899 Other long term (current) drug therapy: Secondary | ICD-10-CM | POA: Diagnosis not present

## 2024-12-28 DIAGNOSIS — Z794 Long term (current) use of insulin: Secondary | ICD-10-CM | POA: Diagnosis not present

## 2024-12-28 DIAGNOSIS — E860 Dehydration: Secondary | ICD-10-CM | POA: Insufficient documentation

## 2024-12-28 LAB — URINALYSIS, ROUTINE W REFLEX MICROSCOPIC
Bilirubin Urine: NEGATIVE
Glucose, UA: NEGATIVE mg/dL
Hgb urine dipstick: NEGATIVE
Ketones, ur: 15 mg/dL — AB
Leukocytes,Ua: NEGATIVE
Nitrite: NEGATIVE
Protein, ur: NEGATIVE mg/dL
Specific Gravity, Urine: 1.012 (ref 1.005–1.030)
pH: 6 (ref 5.0–8.0)

## 2024-12-28 LAB — CBC WITH DIFFERENTIAL/PLATELET
Abs Immature Granulocytes: 0.03 10*3/uL (ref 0.00–0.07)
Basophils Absolute: 0 10*3/uL (ref 0.0–0.1)
Basophils Relative: 0 %
Eosinophils Absolute: 0.2 10*3/uL (ref 0.0–0.5)
Eosinophils Relative: 1 %
HCT: 41.1 % (ref 36.0–46.0)
Hemoglobin: 13.8 g/dL (ref 12.0–15.0)
Immature Granulocytes: 0 %
Lymphocytes Relative: 11 %
Lymphs Abs: 1.2 10*3/uL (ref 0.7–4.0)
MCH: 29.4 pg (ref 26.0–34.0)
MCHC: 33.6 g/dL (ref 30.0–36.0)
MCV: 87.6 fL (ref 80.0–100.0)
Monocytes Absolute: 0.6 10*3/uL (ref 0.1–1.0)
Monocytes Relative: 5 %
Neutro Abs: 9.5 10*3/uL — ABNORMAL HIGH (ref 1.7–7.7)
Neutrophils Relative %: 83 %
Platelets: 255 10*3/uL (ref 150–400)
RBC: 4.69 MIL/uL (ref 3.87–5.11)
RDW: 13.2 % (ref 11.5–15.5)
WBC: 11.6 10*3/uL — ABNORMAL HIGH (ref 4.0–10.5)
nRBC: 0 % (ref 0.0–0.2)

## 2024-12-28 LAB — COMPREHENSIVE METABOLIC PANEL WITH GFR
ALT: 29 U/L (ref 0–44)
AST: 29 U/L (ref 15–41)
Albumin: 4.3 g/dL (ref 3.5–5.0)
Alkaline Phosphatase: 115 U/L (ref 38–126)
Anion gap: 14 (ref 5–15)
BUN: 20 mg/dL (ref 8–23)
CO2: 26 mmol/L (ref 22–32)
Calcium: 9.7 mg/dL (ref 8.9–10.3)
Chloride: 100 mmol/L (ref 98–111)
Creatinine, Ser: 0.81 mg/dL (ref 0.44–1.00)
GFR, Estimated: >60 mL/min
Glucose, Bld: 143 mg/dL — ABNORMAL HIGH (ref 70–99)
Potassium: 4.3 mmol/L (ref 3.5–5.1)
Sodium: 141 mmol/L (ref 135–145)
Total Bilirubin: 0.7 mg/dL (ref 0.0–1.2)
Total Protein: 7.2 g/dL (ref 6.5–8.1)

## 2024-12-28 LAB — LIPASE, BLOOD: Lipase: 22 U/L (ref 11–51)

## 2024-12-28 MED ORDER — SODIUM CHLORIDE 0.9 % IV BOLUS
1000.0000 mL | Freq: Once | INTRAVENOUS | Status: AC
  Administered 2024-12-28: 1000 mL via INTRAVENOUS

## 2024-12-28 MED ORDER — ONDANSETRON HCL 4 MG/2ML IJ SOLN
4.0000 mg | Freq: Once | INTRAMUSCULAR | Status: AC
  Administered 2024-12-28: 4 mg via INTRAVENOUS
  Filled 2024-12-28: qty 2

## 2024-12-28 MED ORDER — HYDROCHLOROTHIAZIDE 25 MG PO TABS: 25.0000 mg | ORAL_TABLET | Freq: Every day | ORAL | 3 refills | Status: AC

## 2024-12-28 NOTE — ED Triage Notes (Signed)
 Pt POV with husband. Stating she had an episode of vomiting 15 mins ago. No abd pain just tightness

## 2024-12-28 NOTE — ED Notes (Signed)
 Asked pt for urine sample but stated she just went. She is currently drinking fluids and will try again soon.

## 2024-12-28 NOTE — ED Provider Notes (Signed)
 " [place] EMERGENCY DEPARTMENT AT [facility] Provider Note   CSN: [id number] Arrival date & time: 12/28/24  1339     Patient presents with: No chief complaint on file.   Diane Tate is a 79 y.o. female.   Pt is a 79 yo female with pmhx significant for breast cancer s/p radiation therapy, CVA, HTN, hypothyroidism, DM2, and TIA.  Pt is here with her husband who is here with n/v/d.  She was initially asymptomatic, but developed n/v while waiting with him, so she also checked in.  She has a little abd pain.       Prior to Admission medications  Medication Sig Start Date End Date Taking? Authorizing Provider  ARTIFICIAL TEAR OP Place 1 drop into both eyes daily as needed (dry eye).    [provider]  DULoxetine (CYMBALTA) 20 MG capsule Take 20 mg by mouth 2 (two) times daily.    [provider]  fluticasone (FLONASE) 50 MCG/ACT nasal spray Place 1 spray into both nostrils daily.    [provider]  hydrochlorothiazide  (HYDRODIURIL ) 25 MG tablet Take 1 tablet (25 mg total) by mouth daily. 12/28/24 03/28/25  Raford Riggs, MD  ibuprofen (ADVIL) 200 MG tablet Take 400 mg by mouth every 6 (six) hours as needed for mild pain.    [provider]  Insulin  Aspart (NOVOLOG  FLEXPEN [place]) Inject 30 Units into the skin 3 (three) times daily before meals.    [provider]  LANTUS  SOLOSTAR 100 UNIT/ML Solostar Pen Inject into the skin.    [provider]  leflunomide  (ARAVA ) 20 MG tablet Take 20 mg by mouth daily. 12/23/24   [provider]  levothyroxine  (SYNTHROID ) 75 MCG tablet Take 75 mcg by mouth daily. 11/22/21   [provider]  Multiple Vitamins-Minerals (MULTIVITAMINS THER. W/MINERALS) TABS tablet Take 1 tablet by mouth daily.    [provider]  MYRBETRIQ 50 MG TB24 tablet  05/04/23   [provider]  rosuvastatin  (CRESTOR ) 40 MG tablet Take 1 tablet (40 mg total) by mouth daily. 03/01/22    Gawaluck, Greylon, MD  telmisartan (MICARDIS) 40 MG tablet Take 40 mg by mouth daily. 12/12/24   [provider]  tiZANidine (ZANAFLEX) 2 MG tablet Take 2 mg by mouth every 8 (eight) hours as needed for muscle spasms. 12/26/24 01/05/25  [provider]  valACYclovir (VALTREX) 500 MG tablet Take 500 mg by mouth daily. 02/07/22   [provider]    Allergies: Latex, Penicillins, Cheese, Atorvastatin, and Lisinopril    Review of Systems  Gastrointestinal:  Positive for abdominal pain, nausea and vomiting.  All other systems reviewed and are negative.   Updated Vital Signs BP (!) 150/59 (BP Location: Right Arm)   Pulse 96   Temp 98.3 F (36.8 C) (Oral)   Resp 16   Ht 5' 6 (1.676 m)   Wt 79.4 kg   SpO2 96%   BMI 28.25 kg/m   Physical Exam Vitals and nursing note reviewed.  Constitutional:      Appearance: Normal appearance.  HENT:     Head: Normocephalic and atraumatic.     Right Ear: External ear normal.     Left Ear: External ear normal.     Nose: Nose normal.     Mouth/Throat:     Mouth: Mucous membranes are dry.     Pharynx: Oropharynx is clear.  Eyes:     Extraocular Movements: Extraocular movements intact.     Conjunctiva/sclera: Conjunctivae  normal.     Pupils: Pupils are equal, round, and reactive to light.  Cardiovascular:     Rate and Rhythm: Normal rate and regular rhythm.     Pulses: Normal pulses.     Heart sounds: Normal heart sounds.  Pulmonary:     Effort: Pulmonary effort is normal.     Breath sounds: Normal breath sounds.  Abdominal:     General: Abdomen is flat. Bowel sounds are normal.     Palpations: Abdomen is soft.  Musculoskeletal:        General: Normal range of motion.     Cervical back: Normal range of motion and neck supple.  Skin:    General: Skin is warm.     Capillary Refill: Capillary refill takes less than 2 seconds.  Neurological:     General: No focal deficit present.     Mental Status: She is alert and  oriented to person, place, and time.  Psychiatric:        Mood and Affect: Mood normal.        Behavior: Behavior normal.     (all labs ordered are listed, but only abnormal results are displayed) Labs Reviewed  CBC WITH DIFFERENTIAL/PLATELET - Abnormal; Notable for the following components:      Result Value   WBC 11.6 (*)    Neutro Abs 9.5 (*)    All other components within normal limits  COMPREHENSIVE METABOLIC PANEL WITH GFR - Abnormal; Notable for the following components:   Glucose, Bld 143 (*)    All other components within normal limits  LIPASE, BLOOD  URINALYSIS, ROUTINE W REFLEX MICROSCOPIC    EKG: None  Radiology: No results found.   Procedures   Medications Ordered in the ED  sodium chloride  0.9 % bolus 1,000 mL (0 mLs Intravenous Stopped 12/28/24 1516)  ondansetron  (ZOFRAN ) injection 4 mg (4 mg Intravenous Given 12/28/24 1410)                                    Medical Decision Making Amount and/or Complexity of Data Reviewed Labs: ordered.  Risk Prescription drug management.   This patient presents to the ED for concern of n/v, this involves an extensive number of treatment options, and is a complaint that carries with it a high risk of complications and morbidity.  The differential diagnosis includes infection, gastritis, uti, electrolyte abn   Co morbidities that complicate the patient evaluation  breast cancer s/p radiation therapy, CVA, HTN, hypothyroidism, DM2, and TIA   Additional history obtained:  Additional history obtained from epic chart review External records from outside source obtained and reviewed including husband   Lab Tests:  I Ordered, and personally interpreted labs.  The pertinent results include:  cbc nl, cmp nl, lip nl   Medicines ordered and prescription drug management:  I ordered medication including ivfs/zofran   for sx  Reevaluation of the patient after these medicines showed that the patient improved I have  reviewed the patients home medicines and have made adjustments as needed   Test Considered:  ct   Critical Interventions:  ivfs  Problem List / ED Course:  N/v/dehydration:  improved.  Pt trying po challenge now.  If she can tolerate po, she can go home   Reevaluation:  After the interventions noted above, I reevaluated the patient and found that they have :improved   Social Determinants of Health:  Lives at home  Dispostion:  After consideration of the diagnostic results and the patients response to treatment, I feel that the patent would benefit from discharge with outpatient f/u.       Final diagnoses:  Nausea and vomiting, unspecified vomiting type  Dehydration    ED Discharge Orders     None          Dean Clarity, MD 12/28/24 1545  "

## 2024-12-28 NOTE — ED Notes (Signed)
 Pts nauseas and abd cramping has improved. Pt is keeping fluids down.

## 2024-12-28 NOTE — ED Provider Notes (Signed)
 Pt with vomiting with husband with similar sx who was also a pt in the ED.  Pt labs are reassuring.  Po challenging now.  Waiting on UA and already has zofran  at home.  Husband neg for c.diff. UA wnl.  Pt imrpoved and tolerating po's.  Stable for d/c home.   Doretha Folks, MD 12/28/24 2312

## 2024-12-28 NOTE — Progress Notes (Signed)
 " Cardiology Office Note:  .   Date:  12/28/2024  ID:  Diane Tate, DOB 06/29/1946, MRN [id number] PCP: Debrah Josette MOHR PA-C  [facility] Providers Cardiologist:  None    History of Present Illness: .    Diane Tate is a 79 y.o. female with nonobstructive CAD, aortic atherosclerosis, hypertension, stroke, RA, depression and breast cancer here to establish care.  She previously saw cardiology at [facility].  She had a stress echocardiogram in 2021 that revealed normal systolic function but inferior and inferoseptal hypokinesis.  She subsequently underwent left heart catheterization 01/2021 which revealed 30% stenosis in the left circumflex and mild disease in the LAD and RCA.  Carotid Doppler 05/2024 revealed no significant stenosis bilaterally.  Chest CT 01/2024 revealed aortic atherosclerosis.  Discussed the use of AI scribe software for clinical note transcription with the patient, who gave verbal consent to proceed.  History of Present Illness Diane Tate has been experiencing elevated blood pressure readings over the past couple of months, which she monitors at home using a cuff. She has documented her readings for review. Last week, her primary care provider recommended doubling her telmisartan dose from 20 mg to 40 mg, which she started around Tuesday of last week. She is seeking further evaluation to determine if this adjustment is sufficient.  BP continues to be mostly >140 systolic.    She has a history of two transient ischemic attacks (TIAs), with the most recent occurring in 2023. She has experienced high blood pressure episodes that led to an emergency room visit due to elevated readings. Her lifestyle has been stressful, contributing to her blood pressure issues. She describes herself as an empath and has been involved in counseling a friend, which she realized was not beneficial for her health. Additionally, she has been affected by current events and news, which has  increased her stress levels. She has made lifestyle changes, including reducing her involvement in stressful activities, such as tutoring and watching the news, to help manage her blood pressure.  She has a history of diabetes, with her most recent A1c being 8.1, up from 7.1 previously. She attributes this increase to stress and is working with her endocrinologist to manage her diabetes. She resides in a retirement community where meals are provided, but she finds the food to be high in salt and often fried, which she tries to manage by choosing soups and salads, although the soups are sometimes too salty.  She experiences sleep disturbances, often waking up early and having difficulty returning to sleep. She uses an appliance at night to help with sleep, which generally improves her rest. 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 She has a history of seeing a therapist for about ten years in the past but has not recently considered returning to therapy.  She engages in physical activities such as chair yoga three times a week and water aerobics in a heated saltwater pool, which she finds beneficial for her rheumatoid arthritis. She reports being short of breath with exertion and has a history of a right lung nodule that was surgically addressed in 2023.  Her past medical history includes rheumatoid arthritis, diabetes, and a history of lung nodules. She has undergone heart catheterization in the past, which showed mild disease. She has also had a carotid procedure following a stroke in 2018. She reports a difference in leg thickness, which was noted by her neurosurgeon,  but she has not been given a specific diagnosis for this.  She consumes two cups of coffee daily and has lost her taste for alcohol. She is not a fan of fish and tries to manage her diet within the constraints of her community's meal  offerings.  ROS:  As per HPI  Studies Reviewed: SABRA   EKG Interpretation Date/Time:  Wednesday December 28 2024 11:34:17 EST Ventricular Rate:  80 PR Interval:  152 QRS Duration:  74 QT Interval:  374 QTC Calculation: 431 R Axis:   7  Text Interpretation: Sinus rhythm Septal infarct , age undetermined No significant change since last tracing Confirmed by Raford Riggs ([id number]) on 12/28/2024 1:42:41 PM     Risk Assessment/Calculations:    CHA2DS2-VASc Score =     This indicates a  % annual risk of stroke. The patient's score is based upon:              Physical Exam:   VS:  BP 134/62 (BP Location: Left Arm, Patient Position: Sitting, Cuff Size: Normal)   Pulse 80   Ht 5' 6 (1.676 m)   Wt 175 lb 14.4 oz (79.8 kg)   SpO2 95%   BMI 28.39 kg/m  , BMI Body mass index is 28.39 kg/m. GENERAL:  Well appearing HEENT: Pupils equal round and reactive, fundi not visualized, oral mucosa unremarkable NECK:  No jugular venous distention, waveform within normal limits, carotid upstroke brisk and symmetric, no bruits, no thyromegaly LUNGS:  Clear to auscultation bilaterally HEART:  RRR.  PMI not displaced or sustained,S1 and S2 within normal limits, no S3, no S4, no clicks, no rubs, no murmurs ABD:  Flat, positive bowel sounds normal in frequency in pitch, no bruits, no rebound, no guarding, no midline pulsatile mass, no hepatomegaly, no splenomegaly EXT:  2 plus pulses throughout, no edema, no cyanosis no clubbing SKIN:  No rashes no nodules NEURO:  Cranial nerves II through XII grossly intact, motor grossly intact throughout PSYCH:  Cognitively intact, oriented to person place and time  ASSESSMENT AND PLAN: .    Assessment & Plan # Hypertension Recent increase in blood pressure. Home readings higher than in-office. Stress and lifestyle factors contributing. No heart failure or edema. BP goal <130/80. - Added hydrochlorothiazide  25mg  daily to regimen. - Continue telmisartan -  Instructed to take hydrochlorothiazide  in the morning. - Recheck labs in one week for potassium and kidney function. - Provided blood pressure tracking sheet. - Scheduled follow-up in 6-8 weeks with blood pressure machine.  # Non-obstructive coronary artery disease Mild disease with 30% blockage. No ischemic symptoms.   - Continue current medical therapy with aspirin  and rosuvastatin . - Encouraged exercise and stress management.  # CVA:  # s/p R CEA:  Medical management as above.  Carotid doppler stable 05/2024.  Recording duration: 25 minutes          Dispo: f/u 2-3 months  Signed, Riggs Raford, MD   "

## 2024-12-28 NOTE — Patient Instructions (Addendum)
 Medication Instructions:  START hydrochlorothiazide  25 MG DAILY   *If you need a refill on your cardiac medications before your next appointment, please call your pharmacy*  Lab Work: BMET IN 1 WEEK   If you have labs (blood work) drawn today and your tests are completely normal, you will receive your results only by: MyChart Message (if you have MyChart) OR A paper copy in the mail If you have any lab test that is abnormal or we need to change your treatment, we will call you to review the results.  Testing/Procedures: NONE  Follow-Up: At [facility], you and your health needs are our priority.  As part of our continuing mission to provide you with exceptional heart care, our providers are all part of one team.  This team includes your primary Cardiologist (physician) and Advanced Practice Providers or APPs (Physician Assistants and Nurse Practitioners) who all work together to provide you with the care you need, when you need it.  Your next appointment:   2 TO 3  month(s)  Provider:   Annabella Scarce, MD, Rosaline Bane, NP, or Reche Finder, NP    We recommend signing up for the patient portal called MyChart.  Sign up information is provided on this After Visit Summary.  MyChart is used to connect with patients for Virtual Visits (Telemedicine).  Patients are able to view lab/test results, encounter notes, upcoming appointments, etc.  Non-urgent messages can be sent to your provider as well.   To learn more about what you can do with MyChart, go to [email or web address].   Other Instructions MONITOR AND LOG YOUR BLOOD PRESSURE DAILY. BRING LOG AND MACHINE TO FOLLOW UP

## 2024-12-28 NOTE — Discharge Instructions (Signed)
 All the blood work looks normal.  You can use the Zofran  as needed.

## 2025-01-06 NOTE — Progress Notes (Unsigned)
 "  Initial neurology clinic note  Reason for Evaluation: Consultation requested by Diane Lucie LABOR, MD for an opinion regarding neuropathy and history of stroke and TIA. My final recommendations will be communicated back to the requesting physician by way of shared medical record or letter to requesting physician via US  mail.  HPI: This is [name], a 79 y.o. 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 The patient is accompanied by ***.  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  Was being followed by [facility] (Dr. Gwyneth in vascular neurology) previously and wanted something closer to home***  The patient has not*** had similar episodes of symptoms in the past. ***  Muscle bulk loss? *** Muscle pain? ***  Cramps/Twitching? *** Suggestion of myotonia/difficulty relaxing after contraction? ***  Fatigable weakness?*** Does strength improve after brief exercise?***  Able to brush hair/teeth without difficulty? *** Able to button shirts/use zips? *** Clumsiness/dropping grasped objects?*** Can you arise from squatted position easily? *** Able to get out of chair without using arms? *** Able to walk up steps easily? *** Use an assistive device to walk? *** Significant imbalance with walking? ***  Falls?*** Any change in urine color, especially after exertion/physical activity? ***  The patient denies*** symptoms suggestive of oculobulbar weakness including diplopia, ptosis, dysphagia, poor saliva control, dysarthria/dysphonia, impaired mastication, facial weakness/droop.  There are no*** neuromuscular respiratory weakness symptoms, particularly orthopnea>dyspnea.   Pseudobulbar affect is absent***.  The patient does not*** report symptoms referable to autonomic dysfunction including impaired sweating, heat or cold intolerance, excessive mucosal dryness, gastroparetic early satiety, postprandial abdominal bloating, constipation, bowel or bladder dyscontrol, erectile dysfunction*** or syncope/presyncope/orthostatic intolerance.  There are no*** complaints relating to other symptoms of small fiber modalities including paresthesia/pain.  The patient has not *** noticed any recent skin rashes nor does he*** report any constitutional symptoms like fever, night sweats, anorexia or unintentional weight loss.  EtOH use: ***  Restrictive diet? *** Family history of neuropathy/myopathy/NM disease?***  Previous labs, electrodiagnostics, and neuroimaging are summarized below, but pertinent findings include***  Any biopsy done? *** Current medications being tried for the patient's symptoms include ***  Prior medications that have been tried: ***   MEDICATIONS:  Outpatient Encounter Medications as of 01/18/2025  Medication Sig   ARTIFICIAL TEAR OP Place 1 drop into both eyes daily as needed (dry eye).   DULoxetine (CYMBALTA) 20 MG capsule Take 20 mg by mouth 2 (two) times daily.   fluticasone (FLONASE) 50 MCG/ACT nasal spray Place 1 spray into both nostrils daily.   hydrochlorothiazide  (HYDRODIURIL ) 25 MG tablet Take 1 tablet (25 mg total) by mouth daily.   ibuprofen (ADVIL) 200 MG tablet Take 400 mg by mouth every 6 (six) hours as needed for mild pain.   Insulin  Aspart (NOVOLOG  FLEXPEN [place])  Inject 30 Units into the skin 3 (  three) times daily before meals.   LANTUS  SOLOSTAR 100 UNIT/ML Solostar Pen Inject into the skin.   leflunomide  (ARAVA ) 20 MG tablet Take 20 mg by mouth daily.   levothyroxine  (SYNTHROID ) 75 MCG tablet Take 75 mcg by mouth daily.   Multiple Vitamins-Minerals (MULTIVITAMINS THER. W/MINERALS) TABS tablet Take 1 tablet by mouth daily.   MYRBETRIQ 50 MG TB24 tablet    rosuvastatin  (CRESTOR ) 40 MG tablet Take 1 tablet (40 mg total) by mouth daily.   telmisartan (MICARDIS) 40 MG tablet Take 40 mg by mouth daily.   valACYclovir (VALTREX) 500 MG tablet Take 500 mg by mouth daily.   No facility-administered encounter medications on Tate as of 01/18/2025.    PAST MEDICAL HISTORY: Past Medical History:  Diagnosis Date   Breast cancer [facility]) 1988   Right Breast   Diabetes mellitus without complication (HCC)    Hypertension    Personal history of radiation therapy 1988   Right Breast   Stroke [facility])    Thyroid  disease    TIA (transient ischemic attack)    x2 - 2018 and 2022    PAST SURGICAL HISTORY: Past Surgical History:  Procedure Laterality Date   BREAST LUMPECTOMY Right 1988   CARDIAC CATHETERIZATION     CAROTID ENDARTERECTOMY     LUNG SURGERY  2023   removed nodule at [facility]    ALLERGIES: Allergies[1]  FAMILY HISTORY: No family history on Tate.  SOCIAL HISTORY: Social History[2] Social History   Social History Narrative   Not on Tate     OBJECTIVE: PHYSICAL EXAM: There were no vitals taken for this visit.  General:*** General appearance: Awake and alert. No distress. Cooperative with exam.  Skin: No obvious rash or jaundice. HEENT: Atraumatic. Anicteric. Lungs: Non-labored breathing on room air  Heart: Regular Abdomen: Soft, non tender. Extremities: No edema. No obvious deformity.  Musculoskeletal: No obvious joint swelling. Psych: Affect appropriate.  Neurological: Mental Status: Alert. Speech fluent. No pseudobulbar  affect Cranial Nerves: CNII: No RAPD. Visual fields grossly intact. CNIII, IV, VI: PERRL. No nystagmus. EOMI. CN V: Facial sensation intact bilaterally to fine touch. [place] clench strong. Jaw jerk***. CN VII: Facial muscles symmetric and strong. No ptosis at rest or after sustained upgaze***. CN VIII: Hearing grossly intact bilaterally. CN IX: No hypophonia. CN X: Palate elevates symmetrically. CN XI: Full strength shoulder shrug bilaterally. CN XII: Tongue protrusion full and midline. No atrophy or fasciculations. No significant dysarthria*** Motor: Tone is ***. *** fasciculations in *** extremities. *** atrophy. No grip or percussive myotonia.***  Individual muscle group testing (MRC grade out of 5):  Movement     Neck flexion ***    Neck extension ***     Right Left   Shoulder abduction *** ***   Shoulder adduction *** ***   Shoulder ext rotation *** ***   Shoulder int rotation *** ***   Elbow flexion *** ***   Elbow extension *** ***   Wrist extension *** ***   Wrist flexion *** ***   Finger abduction - FDI *** ***   Finger abduction - ADM *** ***   Finger extension *** ***   Finger distal flexion - 2/3 *** ***   Finger distal flexion - 4/5 *** ***   Thumb flexion - FPL *** ***   Thumb abduction - APB *** ***    Hip flexion *** ***   Hip extension *** ***   Hip adduction *** ***   Hip abduction *** ***  Knee extension *** ***   Knee flexion *** ***   Dorsiflexion *** ***   Plantarflexion *** ***   Inversion *** ***   Eversion *** ***   Great toe extension *** ***   Great toe flexion *** ***     Reflexes:  Right Left   Bicep *** ***   Tricep *** ***   BrRad *** ***   Knee *** ***   Ankle *** ***    Pathological Reflexes: Babinski: *** response bilaterally*** Hoffman: *** Troemner: *** Pectoral: *** Palmomental: *** Facial: *** Midline tap: *** Sensation: Pinprick: *** Vibration: *** Temperature: *** Proprioception: *** Coordination:  Intact finger-to- nose-finger bilaterally. Romberg negative.*** Gait: Able to rise from chair with arms crossed unassisted. Normal, narrow-based gait. Able to tandem walk. Able to walk on toes and heels.***  Lab and Test Review: Internal labs: 12/28/24: CMP significant for glucose 143 CBC w/ diff significant for WBC 11.6 (neutrophilic)  External labs: 11/29/24: TSH wnl HbA1C: 8.3  Lipid panel (05/06/24): tChol 133, LDL 59, TG 115 B12 (11/05/22): 374  Imaging/Procedures: CTA head and neck (02/28/22): 1. A small filling defect concerning for thrombus is questioned within the V1 right vertebral artery (versus artifact)(see series 11, image 147). 2. The common carotid and internal carotid arteries are patent within the neck. Atherosclerotic plaque results in 20-30% stenosis at the origin of the left ICA. Mild soft plaque scattered within the right common carotid artery. 3. Aortic Atherosclerosis (ICD10-I70.0).   CTA head:   1. No intracranial large vessel occlusion identified. 2. Intracranial atherosclerotic disease. Most notably, there is a severe stenosis within a mid M2 left MCA branch, new from the CTA head of 05/16/2017.  Echocardiogram (03/01/22): 1. Left ventricular ejection fraction, by estimation, is 65 to 70%. The  left ventricle has normal function. The left ventricle has no regional  wall motion abnormalities. There is moderate concentric left ventricular  hypertrophy. Left ventricular  diastolic parameters are consistent with Grade I diastolic dysfunction  (impaired relaxation).   2. Prominenet moderator band. Right ventricular systolic function is  normal. The right ventricular size is normal.   3. Systolic anterior motion of anterior leaflet with mild outflow tract  gradient. The mitral valve is degenerative. Mild mitral valve  regurgitation. Moderate mitral annular calcification.   4. The aortic valve is tricuspid. There is mild calcification of the  aortic valve.  There is mild thickening of the aortic valve. Aortic valve  regurgitation is not visualized. Aortic valve sclerosis/calcification is  present, without any evidence of  aortic stenosis.   5. There is Moderate (Grade III) atheroma plaque involving the aortic  root and ascending aorta.   6. The inferior vena cava is normal in size with <50% respiratory  variability, suggesting right atrial pressure of 8 mmHg.   MRI brain and MRA neck (03/01/22): FINDINGS: MRI HEAD FINDINGS   Brain: Chronic cortical encephalomalacia in the right middle frontal gyrus, in the posterior right parietal lobe, and and along the right parietooccipital sulcus laterally is new since 2018. Hemosiderin associated at the latter. Mild susceptibility artifact there on DWI.   No restricted diffusion to suggest acute infarction. No midline shift, mass effect, evidence of mass lesion, ventriculomegaly, extra-axial collection or acute intracranial hemorrhage. Cervicomedullary junction and pituitary are within normal limits.   Additional Patchy and confluent bilateral cerebral white matter T2 and FLAIR hyperintensity, with chronic white matter lacunar infarct with cystic encephalomalacia of the right corona radiata. Deep gray matter nuclei relatively spared. Brainstem and  cerebellum within normal limits. No other chronic cerebral blood products.   No abnormal enhancement identified. No dural thickening.   Vascular: Major intracranial vascular flow voids are stable since 2018. the major dural venous sinuses are enhancing and appear to be patent.   Skull and upper cervical spine: Visible cervical spine is negative for age. Visualized bone marrow signal is within normal limits.   Sinuses/Orbits: Stable since 2018 and negative.   Other: Mastoids remain clear. Visible internal auditory structures appear normal.   MRA HEAD FINDINGS   Precontrast time-of-flight images demonstrate antegrade flow in the bilateral  cervical carotid and vertebral arteries from the thoracic inlet to the skull base. No flow gaps to suggest hemodynamically significant stenosis. The left vertebral artery appears dominant.   Unfortunately, for the postcontrast portion of the Neck MRA there was a scanner malfunction, and the neck was not imaged as planned. (Rather some coronal venous contaminated MRA images of the head and skull and the upper neck were obtained series 12).   Proximal right vertebral artery therefore not evaluated postcontrast. Irregularity of the vessel on the time-of-flight images resembles that on the CTA yesterday and I favor this is mild atherosclerotic irregularity rather than a filling defect. The left vertebral artery appears to be dominant and arises directly from the arch.   IMPRESSION: 1. No acute intracranial abnormality.   2. Progressed ischemic disease since 2018. 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 Chronic hemosiderin at the latter. Lesser underlying small vessel ischemia.   3. Scanner malfunction during attempted postcontrast Neck MRA. Only time-of-flight Neck MRA images obtained. But favor that mild irregularity of the right V1 segment is simply atherosclerosis rather than a filling defect within the vessel.  MRI cervical spine w/wo contrast (11/27/22): FINDINGS:  Alignment: Straightening of the cervical lordosis. Grade 1  anterolisthesis of C7 on T1. Trace retrolisthesis at C4-C5.   Vertebrae: No fracture, evidence of discitis, or bone lesion.  Discogenic endplate marrow edema at C4-5 and C5-6. Prominent  reactive subchondral marrow signal changes at the C1-C2 articulation  on the right.   Cord: Normal signal and morphology. No abnormal postcontrast  enhancement.   Posterior Fossa, vertebral arteries, paraspinal tissues: Negative.   Disc levels:   C2-C3: No disc protrusion. Advanced left-sided facet joint  arthropathy. No foraminal or canal  stenosis.   C3-C4: Disc bulge, eccentric to the left with left greater than  right uncovertebral spurring and right greater than left facet  arthropathy. Moderate bilateral foraminal stenosis. Mild canal  stenosis.   C4-C5: Disc osteophyte complex with bilateral facet and  uncovertebral arthropathy. Moderate canal stenosis with severe left  and moderate-severe right foraminal stenosis.   C5-C6: Disc osteophyte complex with mass effect upon the cervical  cord and moderate-severe canal stenosis. Bilateral facet and  uncovertebral arthropathy contribute to moderate to severe bilateral  foraminal stenosis.   C6-C7: Disc osteophyte complex with mild facet arthropathy and  uncovertebral spurring. Moderate bilateral foraminal stenosis, left  worse than right. Mild-to-moderate canal stenosis.   C7-T1: No disc protrusion. Bilateral facet arthropathy. No foraminal  or canal stenosis.   IMPRESSION:  1. Multilevel cervical spondylosis, most pronounced at the C5-6  level where there is moderate-to-severe canal stenosis and  moderate-to-severe bilateral foraminal stenosis. There is cord  compression at this level without cord signal alteration.  2. Moderate canal stenosis at C4-5 with severe left and  moderate-severe right foraminal stenosis.  3. Mild-to-moderate canal stenosis at C6-7 with moderate bilateral  foraminal stenosis.  4. Prominent reactive subchondral marrow signal changes at the C1-C2  articulation on the right, which may be a focal source of pain.  5. No evidence of osseous metastatic disease within the cervical  spine.   MRI thoracic spine w/wo contrast (11/27/22): IMPRESSION:  1. No evidence of metastatic disease within the thoracic spine.  2. Mild degenerative changes of the thoracic spine with  borderline-mild foraminal stenosis bilaterally at T2-T3 and on the  right at T4-T5 through T6-T7. No canal stenosis at any level.   Lumbar spine xray (12/12/22): FINDINGS:  No  compression deformities or spondylolisthesis. Extensive  degenerative disc disease identified at each lumbar level with loss  of disc spaces, marginal osteophytes and sclerosis.   IMPRESSION:  Extensive degenerative changes.  No acute osseous abnormalities.   ***  ASSESSMENT: Diane Tate is a 79 y.o. female who presents for evaluation of ***. *** has a relevant medical history of ***. *** neurological examination is pertinent for ***. Available diagnostic data is significant for ***. This constellation of symptoms and objective data would most likely localize to ***. ***  PLAN: -Blood work: *** ***  -Return to clinic ***  The impression above as well as the plan as outlined below were extensively discussed with the patient (in the company of ***) who voiced understanding. All questions were answered to their satisfaction.  The patient was counseled on pertinent fall precautions per the printed material provided today, and as noted under the Patient Instructions section below.***  When available, results of the above investigations and possible further recommendations will be communicated to the patient via telephone/MyChart. Patient to call office if not contacted after expected testing turnaround time.   Total time spent reviewing records, interview, history/exam, documentation, and coordination of care on day of encounter:  *** min   Thank you for allowing me to participate in patient's care.  If I can answer any additional questions, I would be pleased to do so.  Venetia Potters, MD   CC: Debrah Josette ORN., PA-C [address] [place] [address]  CC: Referring provider: Leila Lucie LABOR, MD [id number],  [place] [address]     [1]  Allergies Allergen Reactions   Latex Hives and Itching   Penicillins Hives   Cheese Other (See Comments)    Causes nose to become itchy    Atorvastatin Other (See Comments)    Hair loss     Lisinopril Cough and  Other (See Comments)  [2]  Social History Tobacco Use   Smoking status: Never    Passive exposure: Past   Smokeless tobacco: Never  Substance Use Topics   Alcohol use: Not Currently    Alcohol/week: 1.0 standard drink of alcohol    Types: 1 Cans of beer per week    Comment: I rarely drink alcohol now   Drug use: Never   "

## 2025-01-18 ENCOUNTER — Ambulatory Visit: Admitting: Neurology

## 2025-01-27 ENCOUNTER — Ambulatory Visit: Admitting: Podiatry

## 2025-03-28 ENCOUNTER — Ambulatory Visit: Admitting: Podiatry

## 2025-04-17 ENCOUNTER — Ambulatory Visit (HOSPITAL_BASED_OUTPATIENT_CLINIC_OR_DEPARTMENT_OTHER): Admitting: Cardiovascular Disease
